# Patient Record
Sex: Female | Born: 1960
Health system: Southern US, Community
[De-identification: ages and names within clinical notes are randomized; demographics above are authoritative.]

## PROBLEM LIST (undated history)

## (undated) DIAGNOSIS — Z720 Tobacco use: Secondary | ICD-10-CM

## (undated) DIAGNOSIS — J449 Chronic obstructive pulmonary disease, unspecified: Secondary | ICD-10-CM

## (undated) DIAGNOSIS — M549 Dorsalgia, unspecified: Secondary | ICD-10-CM

## (undated) DIAGNOSIS — E785 Hyperlipidemia, unspecified: Secondary | ICD-10-CM

## (undated) DIAGNOSIS — K219 Gastro-esophageal reflux disease without esophagitis: Secondary | ICD-10-CM

## (undated) DIAGNOSIS — G8929 Other chronic pain: Secondary | ICD-10-CM

## (undated) DIAGNOSIS — K859 Acute pancreatitis without necrosis or infection, unspecified: Secondary | ICD-10-CM

## (undated) DIAGNOSIS — M797 Fibromyalgia: Secondary | ICD-10-CM

## (undated) DIAGNOSIS — Z8719 Personal history of other diseases of the digestive system: Secondary | ICD-10-CM

## (undated) DIAGNOSIS — G5600 Carpal tunnel syndrome, unspecified upper limb: Secondary | ICD-10-CM

## (undated) DIAGNOSIS — M542 Cervicalgia: Secondary | ICD-10-CM

## (undated) DIAGNOSIS — I471 Supraventricular tachycardia, unspecified: Secondary | ICD-10-CM

## (undated) DIAGNOSIS — R0602 Shortness of breath: Secondary | ICD-10-CM

## (undated) DIAGNOSIS — G5620 Lesion of ulnar nerve, unspecified upper limb: Secondary | ICD-10-CM

## (undated) DIAGNOSIS — D649 Anemia, unspecified: Secondary | ICD-10-CM

## (undated) DIAGNOSIS — G629 Polyneuropathy, unspecified: Secondary | ICD-10-CM

## (undated) DIAGNOSIS — Z8709 Personal history of other diseases of the respiratory system: Secondary | ICD-10-CM

## (undated) DIAGNOSIS — IMO0002 Reserved for concepts with insufficient information to code with codable children: Secondary | ICD-10-CM

## (undated) HISTORY — DX: Reserved for concepts with insufficient information to code with codable children: IMO0002

## (undated) HISTORY — DX: Fibromyalgia: M79.7

## (undated) HISTORY — DX: Hyperlipidemia, unspecified: E78.5

## (undated) HISTORY — DX: Gastro-esophageal reflux disease without esophagitis: K21.9

## (undated) HISTORY — DX: Supraventricular tachycardia, unspecified: I47.10

## (undated) HISTORY — PX: SPINAL FIXATION SURGERY W/ IMPLANT: SHX785

## (undated) HISTORY — DX: Supraventricular tachycardia: I47.1

## (undated) HISTORY — PX: BACK SURGERY: SHX140

## (undated) HISTORY — DX: Lesion of ulnar nerve, unspecified upper limb: G56.20

## (undated) HISTORY — DX: Carpal tunnel syndrome, unspecified upper limb: G56.00

## (undated) HISTORY — DX: Tobacco use: Z72.0

---

## 1993-06-07 HISTORY — PX: TUBAL LIGATION: SHX77

## 1999-07-09 ENCOUNTER — Ambulatory Visit: Admission: RE | Admit: 1999-07-09 | Discharge: 1999-07-09 | Payer: Self-pay | Admitting: Pulmonary Disease

## 1999-10-19 ENCOUNTER — Ambulatory Visit (HOSPITAL_COMMUNITY): Admission: RE | Admit: 1999-10-19 | Discharge: 1999-10-19 | Payer: Self-pay | Admitting: Gastroenterology

## 1999-10-19 ENCOUNTER — Encounter: Payer: Self-pay | Admitting: Gastroenterology

## 2000-04-19 ENCOUNTER — Encounter: Payer: Self-pay | Admitting: Gastroenterology

## 2000-04-19 ENCOUNTER — Ambulatory Visit (HOSPITAL_COMMUNITY): Admission: RE | Admit: 2000-04-19 | Discharge: 2000-04-19 | Payer: Self-pay | Admitting: Gastroenterology

## 2000-06-27 ENCOUNTER — Ambulatory Visit (HOSPITAL_COMMUNITY): Admission: RE | Admit: 2000-06-27 | Discharge: 2000-06-27 | Payer: Self-pay | Admitting: Pulmonary Disease

## 2000-06-27 ENCOUNTER — Encounter: Payer: Self-pay | Admitting: Pulmonary Disease

## 2000-07-01 ENCOUNTER — Ambulatory Visit (HOSPITAL_COMMUNITY): Admission: RE | Admit: 2000-07-01 | Discharge: 2000-07-01 | Payer: Self-pay | Admitting: Pulmonary Disease

## 2000-07-01 ENCOUNTER — Encounter: Payer: Self-pay | Admitting: Pulmonary Disease

## 2000-07-05 ENCOUNTER — Ambulatory Visit: Admission: RE | Admit: 2000-07-05 | Discharge: 2000-07-05 | Payer: Self-pay | Admitting: Pulmonary Disease

## 2000-11-08 ENCOUNTER — Encounter: Payer: Self-pay | Admitting: Orthopedic Surgery

## 2000-11-08 ENCOUNTER — Ambulatory Visit (HOSPITAL_COMMUNITY): Admission: RE | Admit: 2000-11-08 | Discharge: 2000-11-08 | Payer: Self-pay | Admitting: Orthopedic Surgery

## 2000-11-25 ENCOUNTER — Ambulatory Visit (HOSPITAL_COMMUNITY): Admission: RE | Admit: 2000-11-25 | Discharge: 2000-11-25 | Payer: Self-pay | Admitting: Orthopedic Surgery

## 2000-11-25 ENCOUNTER — Encounter: Payer: Self-pay | Admitting: Orthopedic Surgery

## 2000-11-27 ENCOUNTER — Ambulatory Visit (HOSPITAL_COMMUNITY): Admission: RE | Admit: 2000-11-27 | Discharge: 2000-11-27 | Payer: Self-pay | Admitting: Orthopedic Surgery

## 2000-11-27 ENCOUNTER — Encounter: Payer: Self-pay | Admitting: Orthopedic Surgery

## 2000-12-06 ENCOUNTER — Encounter (HOSPITAL_COMMUNITY): Admission: RE | Admit: 2000-12-06 | Discharge: 2001-01-04 | Payer: Self-pay | Admitting: Orthopedic Surgery

## 2001-01-04 ENCOUNTER — Encounter: Admission: RE | Admit: 2001-01-04 | Discharge: 2001-01-04 | Payer: Self-pay | Admitting: Hematology & Oncology

## 2001-03-13 ENCOUNTER — Encounter: Payer: Self-pay | Admitting: Urology

## 2001-03-13 ENCOUNTER — Ambulatory Visit (HOSPITAL_COMMUNITY): Admission: RE | Admit: 2001-03-13 | Discharge: 2001-03-13 | Payer: Self-pay | Admitting: Urology

## 2001-04-25 ENCOUNTER — Encounter: Admission: RE | Admit: 2001-04-25 | Discharge: 2001-04-25 | Payer: Self-pay | Admitting: Neurosurgery

## 2001-05-18 ENCOUNTER — Inpatient Hospital Stay (HOSPITAL_COMMUNITY): Admission: RE | Admit: 2001-05-18 | Discharge: 2001-05-22 | Payer: Self-pay | Admitting: Neurosurgery

## 2001-09-14 ENCOUNTER — Encounter: Admission: RE | Admit: 2001-09-14 | Discharge: 2001-10-06 | Payer: Self-pay | Admitting: Neurosurgery

## 2002-06-13 ENCOUNTER — Ambulatory Visit (HOSPITAL_COMMUNITY): Admission: RE | Admit: 2002-06-13 | Discharge: 2002-06-13 | Payer: Self-pay | Admitting: Urology

## 2002-06-13 ENCOUNTER — Encounter: Payer: Self-pay | Admitting: Urology

## 2002-06-28 ENCOUNTER — Ambulatory Visit (HOSPITAL_COMMUNITY): Admission: RE | Admit: 2002-06-28 | Discharge: 2002-06-28 | Payer: Self-pay | Admitting: Neurosurgery

## 2003-09-30 ENCOUNTER — Ambulatory Visit (HOSPITAL_COMMUNITY): Admission: RE | Admit: 2003-09-30 | Discharge: 2003-09-30 | Payer: Self-pay | Admitting: Internal Medicine

## 2003-10-03 ENCOUNTER — Ambulatory Visit (HOSPITAL_COMMUNITY): Admission: RE | Admit: 2003-10-03 | Discharge: 2003-10-03 | Payer: Self-pay | Admitting: Internal Medicine

## 2003-11-13 ENCOUNTER — Ambulatory Visit (HOSPITAL_COMMUNITY): Admission: RE | Admit: 2003-11-13 | Discharge: 2003-11-13 | Payer: Self-pay | Admitting: Neurosurgery

## 2003-11-24 ENCOUNTER — Emergency Department (HOSPITAL_COMMUNITY): Admission: EM | Admit: 2003-11-24 | Discharge: 2003-11-24 | Payer: Self-pay | Admitting: Emergency Medicine

## 2004-01-01 ENCOUNTER — Ambulatory Visit (HOSPITAL_COMMUNITY): Admission: RE | Admit: 2004-01-01 | Discharge: 2004-01-01 | Payer: Self-pay | Admitting: Internal Medicine

## 2004-01-07 ENCOUNTER — Ambulatory Visit (HOSPITAL_COMMUNITY): Admission: RE | Admit: 2004-01-07 | Discharge: 2004-01-07 | Payer: Self-pay | Admitting: Internal Medicine

## 2004-01-15 ENCOUNTER — Ambulatory Visit (HOSPITAL_COMMUNITY): Admission: RE | Admit: 2004-01-15 | Discharge: 2004-01-15 | Payer: Self-pay | Admitting: Internal Medicine

## 2004-06-29 ENCOUNTER — Ambulatory Visit (HOSPITAL_COMMUNITY): Admission: RE | Admit: 2004-06-29 | Discharge: 2004-06-29 | Payer: Self-pay | Admitting: Family Medicine

## 2004-07-23 ENCOUNTER — Emergency Department (HOSPITAL_COMMUNITY): Admission: EM | Admit: 2004-07-23 | Discharge: 2004-07-23 | Payer: Self-pay | Admitting: Emergency Medicine

## 2004-09-04 ENCOUNTER — Ambulatory Visit (HOSPITAL_COMMUNITY): Admission: RE | Admit: 2004-09-04 | Discharge: 2004-09-04 | Payer: Self-pay | Admitting: *Deleted

## 2004-10-30 ENCOUNTER — Observation Stay (HOSPITAL_COMMUNITY): Admission: EM | Admit: 2004-10-30 | Discharge: 2004-10-30 | Payer: Self-pay | Admitting: Emergency Medicine

## 2005-03-14 ENCOUNTER — Emergency Department (HOSPITAL_COMMUNITY): Admission: EM | Admit: 2005-03-14 | Discharge: 2005-03-14 | Payer: Self-pay | Admitting: Emergency Medicine

## 2007-06-08 HISTORY — PX: CARPAL TUNNEL RELEASE: SHX101

## 2007-06-08 HISTORY — PX: GANGLION CYST EXCISION: SHX1691

## 2007-12-26 ENCOUNTER — Emergency Department (HOSPITAL_COMMUNITY): Admission: EM | Admit: 2007-12-26 | Discharge: 2007-12-26 | Payer: Self-pay | Admitting: Emergency Medicine

## 2008-02-05 ENCOUNTER — Ambulatory Visit: Payer: Self-pay | Admitting: Orthopedic Surgery

## 2008-02-05 DIAGNOSIS — G5600 Carpal tunnel syndrome, unspecified upper limb: Secondary | ICD-10-CM | POA: Insufficient documentation

## 2008-02-09 ENCOUNTER — Emergency Department (HOSPITAL_COMMUNITY): Admission: EM | Admit: 2008-02-09 | Discharge: 2008-02-09 | Payer: Self-pay | Admitting: Emergency Medicine

## 2008-02-20 ENCOUNTER — Telehealth: Payer: Self-pay | Admitting: Orthopedic Surgery

## 2008-02-28 ENCOUNTER — Encounter: Payer: Self-pay | Admitting: Orthopedic Surgery

## 2008-03-05 ENCOUNTER — Encounter: Payer: Self-pay | Admitting: Orthopedic Surgery

## 2008-03-13 ENCOUNTER — Ambulatory Visit: Payer: Self-pay | Admitting: Orthopedic Surgery

## 2008-03-13 DIAGNOSIS — G5620 Lesion of ulnar nerve, unspecified upper limb: Secondary | ICD-10-CM

## 2008-03-22 ENCOUNTER — Telehealth: Payer: Self-pay | Admitting: Orthopedic Surgery

## 2008-05-06 ENCOUNTER — Encounter: Payer: Self-pay | Admitting: Orthopedic Surgery

## 2008-07-26 ENCOUNTER — Encounter: Payer: Self-pay | Admitting: Orthopedic Surgery

## 2008-08-15 ENCOUNTER — Encounter: Payer: Self-pay | Admitting: *Deleted

## 2008-08-15 ENCOUNTER — Ambulatory Visit (HOSPITAL_COMMUNITY): Admission: RE | Admit: 2008-08-15 | Discharge: 2008-08-15 | Payer: Self-pay | Admitting: Family Medicine

## 2008-08-15 LAB — CONVERTED CEMR LAB
Albumin: 4.6 g/dL
Alkaline Phosphatase: 105 units/L
CO2: 23 meq/L
Chloride: 102 meq/L
Eosinophils Absolute: 0.2 10*3/uL
Eosinophils Relative: 2 %
Hemoglobin: 14.6 g/dL
LDL Cholesterol: 170 mg/dL
Lymphs Abs: 2.2 10*3/uL
Monocytes Relative: 5 %
RBC: 4.68 M/uL
Sodium: 140 meq/L
Total Protein: 7.2 g/dL

## 2008-08-30 ENCOUNTER — Encounter: Payer: Self-pay | Admitting: Orthopedic Surgery

## 2008-11-13 ENCOUNTER — Encounter (INDEPENDENT_AMBULATORY_CARE_PROVIDER_SITE_OTHER): Payer: Self-pay | Admitting: *Deleted

## 2008-12-13 ENCOUNTER — Encounter: Payer: Self-pay | Admitting: Orthopedic Surgery

## 2009-02-03 DIAGNOSIS — R110 Nausea: Secondary | ICD-10-CM

## 2009-02-03 DIAGNOSIS — R1013 Epigastric pain: Secondary | ICD-10-CM

## 2009-02-03 DIAGNOSIS — R109 Unspecified abdominal pain: Secondary | ICD-10-CM | POA: Insufficient documentation

## 2009-02-04 ENCOUNTER — Encounter (INDEPENDENT_AMBULATORY_CARE_PROVIDER_SITE_OTHER): Payer: Self-pay | Admitting: *Deleted

## 2009-06-19 ENCOUNTER — Emergency Department (HOSPITAL_COMMUNITY): Admission: EM | Admit: 2009-06-19 | Discharge: 2009-06-19 | Payer: Self-pay | Admitting: Emergency Medicine

## 2009-07-03 ENCOUNTER — Emergency Department (HOSPITAL_COMMUNITY): Admission: EM | Admit: 2009-07-03 | Discharge: 2009-07-03 | Payer: Self-pay | Admitting: Internal Medicine

## 2009-07-06 ENCOUNTER — Emergency Department (HOSPITAL_COMMUNITY): Admission: EM | Admit: 2009-07-06 | Discharge: 2009-07-06 | Payer: Self-pay | Admitting: Emergency Medicine

## 2009-08-11 ENCOUNTER — Emergency Department (HOSPITAL_COMMUNITY): Admission: EM | Admit: 2009-08-11 | Discharge: 2009-08-11 | Payer: Self-pay | Admitting: Emergency Medicine

## 2009-09-01 ENCOUNTER — Emergency Department (HOSPITAL_COMMUNITY): Admission: EM | Admit: 2009-09-01 | Discharge: 2009-09-01 | Payer: Self-pay | Admitting: Emergency Medicine

## 2009-10-13 ENCOUNTER — Encounter (INDEPENDENT_AMBULATORY_CARE_PROVIDER_SITE_OTHER): Payer: Self-pay | Admitting: *Deleted

## 2010-04-07 ENCOUNTER — Encounter: Payer: Self-pay | Admitting: Orthopedic Surgery

## 2010-04-22 ENCOUNTER — Encounter: Admission: RE | Admit: 2010-04-22 | Discharge: 2010-04-22 | Payer: Self-pay | Admitting: Neurosurgery

## 2010-05-10 ENCOUNTER — Emergency Department (HOSPITAL_COMMUNITY)
Admission: EM | Admit: 2010-05-10 | Discharge: 2010-05-10 | Payer: Self-pay | Source: Home / Self Care | Admitting: Emergency Medicine

## 2010-05-10 ENCOUNTER — Encounter: Payer: Self-pay | Admitting: Cardiology

## 2010-05-22 ENCOUNTER — Ambulatory Visit: Payer: Self-pay | Admitting: Cardiology

## 2010-05-27 ENCOUNTER — Encounter: Payer: Self-pay | Admitting: Orthopedic Surgery

## 2010-06-05 ENCOUNTER — Encounter: Payer: Self-pay | Admitting: Cardiology

## 2010-06-12 ENCOUNTER — Encounter
Admission: RE | Admit: 2010-06-12 | Discharge: 2010-06-12 | Payer: Self-pay | Source: Home / Self Care | Attending: Neurosurgery | Admitting: Neurosurgery

## 2010-06-28 ENCOUNTER — Encounter: Payer: Self-pay | Admitting: Internal Medicine

## 2010-07-07 NOTE — Consult Note (Signed)
Summary: [facility] consult Dr Nelida Meuse consult Dr Lovell Sheehan   Imported By: Cammie Sickle 04/07/2010 18:31:58  _____________________________________________________________________  External Attachment:    Type:   Image     Comment:   External Document

## 2010-07-07 NOTE — Letter (Signed)
Summary: *Orthopedic No Show Letter  [facility]  [address]  [place], [place] [address]   Phone: [phone]  Fax: [phone]     10/13/2009    Susan Potts [address] [place]  [address]    Dear Ms. Delford Field,   Our records indicate that you missed your scheduled appointment with Dr. Beaulah Corin on 10/08/2009.  Please contact this office to reschedule your appointment as soon as possible.  It is important that you keep your scheduled appointments with your physician, so we can provide you the best care possible.        Sincerely,    Dr. Terrance Mass, MD [facility] Phone [phone]

## 2010-07-08 HISTORY — PX: LUMBAR DISC SURGERY: SHX700

## 2010-07-09 NOTE — Consult Note (Signed)
Summary: Consultation Report from Dr. Tressie Stalker  Consultation Report from Dr. Tressie Stalker   Imported By: Jacklynn Ganong 06/02/2010 16:44:07  _____________________________________________________________________  External Attachment:    Type:   Image     Comment:   External Document

## 2010-07-09 NOTE — Miscellaneous (Signed)
Summary: ROV post-hosp; pt. left prior to eval. by MD   Visit Type:  Initial Consult   History of Present Illness: pt was not seen this day, she left for a prior appt., she cancelled her appt that she rescheduled for the following day  Current Medications (verified): 1)  Promethazine Hcl 25 Mg Tabs (Promethazine Hcl) .... Taqke As Needed 2)  Norco 10-325 Mg Tabs (Hydrocodone-Acetaminophen) .... Take As Needed 3)  Flexeril 10 Mg Tabs (Cyclobenzaprine Hcl) .... Take As Needed 4)  Atenolol 25 Mg Tabs (Atenolol) .... Take 1 Tab Daily  Allergies (verified): 1)  ! Codeine 2)  ! Sulfa  Vital Signs:  Patient profile:   50 year old female Weight:      176 pounds O2 Sat:      97 % on Room air Pulse rate:   85 / minute BP sitting:   100 / 71  (right arm)  Vitals Entered By: Teressa Lower RN (June 05, 2010 1:37 PM)  O2 Flow:  Room air

## 2010-07-14 ENCOUNTER — Encounter: Payer: Self-pay | Admitting: Orthopedic Surgery

## 2010-07-28 ENCOUNTER — Encounter: Payer: Self-pay | Admitting: Cardiology

## 2010-08-04 ENCOUNTER — Other Ambulatory Visit (HOSPITAL_COMMUNITY): Payer: Medicare HMO

## 2010-08-04 NOTE — Letter (Signed)
Summary: [facility] office notes Dr Nelida Meuse office notes Dr Lovell Sheehan   Imported By: Cammie Sickle 07/31/2010 13:57:57  _____________________________________________________________________  External Attachment:    Type:   Image     Comment:   External Document

## 2010-08-05 ENCOUNTER — Inpatient Hospital Stay (HOSPITAL_COMMUNITY)
Admission: RE | Admit: 2010-08-05 | Discharge: 2010-08-06 | DRG: 491 | Disposition: A | Discharge status: Home / Self Care | Payer: Medicare HMO | Source: Ambulatory Visit | Attending: Neurosurgery | Admitting: Neurosurgery

## 2010-08-05 ENCOUNTER — Inpatient Hospital Stay (HOSPITAL_COMMUNITY): Payer: Medicare HMO

## 2010-08-05 DIAGNOSIS — G4733 Obstructive sleep apnea (adult) (pediatric): Secondary | ICD-10-CM | POA: Diagnosis present

## 2010-08-05 DIAGNOSIS — M5126 Other intervertebral disc displacement, lumbar region: Principal | ICD-10-CM | POA: Diagnosis present

## 2010-08-05 DIAGNOSIS — J4489 Other specified chronic obstructive pulmonary disease: Secondary | ICD-10-CM | POA: Diagnosis present

## 2010-08-05 DIAGNOSIS — J449 Chronic obstructive pulmonary disease, unspecified: Secondary | ICD-10-CM | POA: Diagnosis present

## 2010-08-05 DIAGNOSIS — K219 Gastro-esophageal reflux disease without esophagitis: Secondary | ICD-10-CM | POA: Diagnosis present

## 2010-08-05 DIAGNOSIS — F17200 Nicotine dependence, unspecified, uncomplicated: Secondary | ICD-10-CM | POA: Diagnosis present

## 2010-08-05 DIAGNOSIS — M4800 Spinal stenosis, site unspecified: Secondary | ICD-10-CM | POA: Diagnosis present

## 2010-08-05 LAB — CBC
HCT: 40.3 % (ref 36.0–46.0)
Hemoglobin: 13.7 g/dL (ref 12.0–15.0)
MCH: 32 pg (ref 26.0–34.0)
MCHC: 34 g/dL (ref 30.0–36.0)
RDW: 13.4 % (ref 11.5–15.5)

## 2010-08-05 LAB — BASIC METABOLIC PANEL
BUN: 12 mg/dL (ref 6–23)
CO2: 29 mEq/L (ref 19–32)
Calcium: 9.3 mg/dL (ref 8.4–10.5)
Creatinine, Ser: 0.6 mg/dL (ref 0.4–1.2)
GFR calc non Af Amer: >60 mL/min (ref >60)
Glucose, Bld: 99 mg/dL (ref 70–99)
Sodium: 141 mEq/L (ref 135–145)

## 2010-08-14 NOTE — Op Note (Signed)
Susan Potts, MCCUISTION              ACCOUNT NO.:  [id number]  MEDICAL RECORD NO.:  [id number]           PATIENT TYPE:  I  LOCATION:  3536                         FACILITY:  [facility]  PHYSICIAN:  Cristi Loron, M.D.DATE OF BIRTH:  [birth year]  DATE OF PROCEDURE:  08/05/2010 DATE OF DISCHARGE:                              OPERATIVE REPORT   BRIEF HISTORY:  The patient is a 50 year old white female who has suffered from back and left leg pain consistent with a left L3 radiculopathy.  She has failed medical management and was worked up with a lumbar MRI, which demonstrated the patient a far lateral ruptured disk at L3-4 on the left.  I discussed the various treatment options with the patient including surgery.  She has weighed the risks, benefits and alternatives of surgery and decided to proceed with the left L3-4 far lateral diskectomy.  PREOPERATIVE DIAGNOSES: 1. Left L3-4 far lateral herniated nucleus pulposus. 2. Lumbar radiculopathy. 3. Lumbago. 4. Spinal stenosis.  POSTOPERATIVE DIAGNOSES: 1. Left L3-4 far lateral herniated nucleus pulposus. 2. Lumbar radiculopathy. 3. Lumbago. 4. Spinal stenosis.  PROCEDURE:  Left L3-4 far lateral diskectomy using microdissection.  SURGEON:  Cristi Loron, MD  ASSISTANT:  Clydene Fake, MD  ANESTHESIA:  General endotracheal.  ESTIMATED BLOOD LOSS:  25 mL.  SPECIMENS:  None.  DRAINS:  None.  COMPLICATIONS:  None.  DESCRIPTION OF PROCEDURE:  The patient was brought to the operating room by the Anesthesia Team.  General endotracheal anesthesia was induced. The patient was turned to the prone position on the Wilson frame.  The lumbosacral region was then prepared with Betadine scrub and Betadine solution.  Sterile drapes were applied.  I then injected the area to be incised with Marcaine with epinephrine solution.  A scalpel to make a linear midline incision at the L3-4 interspace.  I used electrocautery to perform a  left-sided subperiosteal dissection exposing the spinous process and lamina of L3 and L4.  We obtained intraoperative radiograph and then inserted the [facility] retractor for exposure.  We then brought the operative microscope into the field and under its magnification and illumination, we completed the microdissection/decompression.  I used a speed drill to drill the lateral aspect of the left L3 pars.  I then used microdissection to dissect through the interspinous ligament.  I removed this with Kerrison punch.  This exposed the exiting L3 nerve root.  I used microdissection to free up the nerve root and then just caudal to the nerve root was a free fragment disk herniation.  I removed it with multiple fragments using the pituitary forceps.  I then inspected the intervertebral disk L3-4.  There was no large holes in the annulus, so I therefore did not enter into the intervertebral disk space.  I palpated along the exit route of the L3 nerve root and noted it was well decompressed from the intraspinal portion all the way into soft tissues.  We then obtained hemostasis using bipolar cautery.  I irrigated the wound out with bacitracin solution.  We then removed the retractor and then reapproximated the patient's thoracolumbar fascia with interrupted #1 Vicryl suture,  the subcutaneous tissue with interrupted 2-0 Vicryl suture, and the skin with Steri-Strips and Benzoin.  The wound was then coated with bacitracin ointment and sterile dressing was applied.  The drapes were removed.  The patient was subsequently returned to the supine position where she was extubated by the Anesthesia Team and transported to Post Anesthesia Care Unit in stable condition.  All sponge, instrument and needle counts were correct at the end of this case.     Cristi Loron, M.D.     JDJ/MEDQ  D:  08/05/2010  T:  08/06/2010  Job:  [id number]  Electronically Signed by Tressie Stalker M.D. on 08/13/2010  02:37:56 PM

## 2010-08-17 ENCOUNTER — Encounter: Payer: Self-pay | Admitting: *Deleted

## 2010-08-17 LAB — POCT CARDIAC MARKERS
CKMB, poc: <1 ng/mL — ABNORMAL LOW (ref 1.0–8.0)
Myoglobin, poc: 26.2 ng/mL (ref 12–200)

## 2010-08-17 LAB — DIFFERENTIAL
Eosinophils Absolute: 0.1 10*3/uL (ref 0.0–0.7)
Lymphocytes Relative: 27 % (ref 12–46)
Lymphs Abs: 2.4 10*3/uL (ref 0.7–4.0)
Neutro Abs: 6 10*3/uL (ref 1.7–7.7)
Neutrophils Relative %: 66 % (ref 43–77)

## 2010-08-17 LAB — CBC
MCH: 32.6 pg (ref 26.0–34.0)
Platelets: 167 10*3/uL (ref 150–400)
RBC: 4.66 MIL/uL (ref 3.87–5.11)
WBC: 9.1 10*3/uL (ref 4.0–10.5)

## 2010-08-17 LAB — BASIC METABOLIC PANEL
Calcium: 8.9 mg/dL (ref 8.4–10.5)
Creatinine, Ser: 0.7 mg/dL (ref 0.4–1.2)
GFR calc Af Amer: >60 mL/min (ref >60)
GFR calc non Af Amer: >60 mL/min (ref >60)
Sodium: 138 mEq/L (ref 135–145)

## 2010-08-18 ENCOUNTER — Encounter (INDEPENDENT_AMBULATORY_CARE_PROVIDER_SITE_OTHER): Payer: Medicare HMO | Admitting: Cardiology

## 2010-08-18 ENCOUNTER — Encounter: Payer: Self-pay | Admitting: Cardiology

## 2010-08-18 DIAGNOSIS — J449 Chronic obstructive pulmonary disease, unspecified: Secondary | ICD-10-CM

## 2010-08-18 DIAGNOSIS — I471 Supraventricular tachycardia: Secondary | ICD-10-CM

## 2010-08-18 DIAGNOSIS — R079 Chest pain, unspecified: Secondary | ICD-10-CM

## 2010-08-18 DIAGNOSIS — F17200 Nicotine dependence, unspecified, uncomplicated: Secondary | ICD-10-CM

## 2010-08-18 DIAGNOSIS — E7800 Pure hypercholesterolemia, unspecified: Secondary | ICD-10-CM

## 2010-08-19 ENCOUNTER — Encounter: Payer: Self-pay | Admitting: Adult Health

## 2010-08-23 LAB — DIFFERENTIAL
Basophils Absolute: 0 10*3/uL (ref 0.0–0.1)
Basophils Relative: 1 % (ref 0–1)
Neutro Abs: 4 10*3/uL (ref 1.7–7.7)
Neutrophils Relative %: 57 % (ref 43–77)

## 2010-08-23 LAB — BASIC METABOLIC PANEL
CO2: 28 mEq/L (ref 19–32)
Calcium: 9.4 mg/dL (ref 8.4–10.5)
Creatinine, Ser: 0.57 mg/dL (ref 0.4–1.2)
GFR calc Af Amer: >60 mL/min (ref >60)
Glucose, Bld: 111 mg/dL — ABNORMAL HIGH (ref 70–99)

## 2010-08-23 LAB — CBC
MCHC: 34.7 g/dL (ref 30.0–36.0)
RBC: 4.51 MIL/uL (ref 3.87–5.11)
RDW: 12.9 % (ref 11.5–15.5)

## 2010-08-25 NOTE — Miscellaneous (Signed)
Summary: CBCD,CMP,LIPIDS,TSH,08/15/2008  Clinical Lists Changes  Observations: Added new observation of CALCIUM: 9.7 mg/dL (54/02/8118 14:78) Added new observation of ALBUMIN: 4.6 g/dL (29/56/2130 86:57) Added new observation of PROTEIN, TOT: 7.2 g/dL (84/69/6295 28:41) Added new observation of SGPT (ALT): 11 units/L (08/15/2008 10:39) Added new observation of SGOT (AST): 13 units/L (08/15/2008 10:39) Added new observation of ALK PHOS: 105 units/L (08/15/2008 10:39) Added new observation of CREATININE: 0.64 mg/dL (32/44/0102 72:53) Added new observation of BUN: 13 mg/dL (66/44/0347 42:59) Added new observation of BG RANDOM: 100 mg/dL (56/38/7564 33:29) Added new observation of CO2 PLSM/SER: 23 meq/L (08/15/2008 10:39) Added new observation of CL SERUM: 102 meq/L (08/15/2008 10:39) Added new observation of K SERUM: 4.5 meq/L (08/15/2008 10:39) Added new observation of NA: 140 meq/L (08/15/2008 10:39) Added new observation of LDL: 170 mg/dL (51/88/4166 06:30) Added new observation of HDL: 51 mg/dL (16/06/930 35:57) Added new observation of TRIGLYC TOT: 293 mg/dL (32/20/2542 70:62) Added new observation of CHOLESTEROL: 280 mg/dL (37/62/8315 17:61) Added new observation of TSH: 1.680 microintl units/mL (08/15/2008 10:39) Added new observation of ABSOLUTE BAS: 0.0 K/uL (08/15/2008 10:39) Added new observation of BASOPHIL %: 0 % (08/15/2008 10:39) Added new observation of EOS ABSLT: 0.2 K/uL (08/15/2008 10:39) Added new observation of % EOS AUTO: 2 % (08/15/2008 10:39) Added new observation of ABSOLUTE MON: 0.5 K/uL (08/15/2008 10:39) Added new observation of MONOCYTE %: 5 % (08/15/2008 10:39) Added new observation of ABS LYMPHOCY: 2.2 K/uL (08/15/2008 10:39) Added new observation of LYMPHS %: 26 % (08/15/2008 10:39) Added new observation of PLATELETK/UL: 214 K/uL (08/15/2008 10:39) Added new observation of RDW: 13.5 % (08/15/2008 10:39) Added new observation of MCHC RBC: 32.9 g/dL  (60/73/7106 26:94) Added new observation of MCV: 94.9 fL (08/15/2008 10:39) Added new observation of HCT: 44.4 % (08/15/2008 10:39) Added new observation of HGB: 14.6 g/dL (85/46/2703 50:09) Added new observation of RBC M/UL: 4.68 M/uL (08/15/2008 10:39) Added new observation of WBC COUNT: 8.5 10*3/microliter (08/15/2008 10:39)

## 2010-08-25 NOTE — Assessment & Plan Note (Signed)
Summary: NPH...SVT...SEEN IN ER [place] PER [facility] TO [place] OFFI...   Visit Type:  Follow-up Primary Provider:  Dr.Mcphal [facility]   History of Present Illness: Susan Potts is a 46 CF we are seeing at that request of Dr. Tressa Busman of [facility] for evaluation of SVT.  Susan Potts has been seen in [facility] ER in December of 2011 after chasing her dog and having a rapid HR that would not subside. EMS was called and (per pt) she was having SVT.  Review of ER records from that time recorded that SVT that was resolved by adenosine.  She was restarted on atenolol which she had stopped taking.  She has had a left L3 far lateral diskectomy on Aug 05, 2010 and therefore was delayed in seeking cardiac evaluation.  She has been followed by [facility] in the past which included a cardiac catherization completed 10/2004 in the setting of chest pain.  This revealed normal coronary arteries, normal LV fx, and normal renal arteries.  She was also seen by a physician in [facility] and had a cardiolite stress test which caused her to have rapid HR-SVT per records.  She has been feeling generally lousey since surgery. She continues chronic pain, heartburn, myalgia symptoms, and chronic DOE.  She denies recurrance of SVT since restarting the atenolol.  Preventive Screening-Counseling & Management  Alcohol-Tobacco     Alcohol drinks/day: 0     Smoking Status: current     Smoking Cessation Counseling: yes     Smoke Cessation Stage: precontemplative     Packs/Day: 1.0     Year Started: 1975     Pack years: 40  Current Medications (verified): 1)  Promethazine Hcl 25 Mg Tabs (Promethazine Hcl) .... Taqke As Needed 2)  Flexeril 10 Mg Tabs (Cyclobenzaprine Hcl) .... Take As Needed 3)  Percocet 10-325 Mg Tabs (Oxycodone-Acetaminophen) .... As Needed 4)  Xanax 0.25 Mg Tabs (Alprazolam) .... Take Three Times A Day 5)  Diazepam 5 Mg/ml Soln (Diazepam) .... As Needed 6)  Promethazine Vc/codeine 6.25-5-10  Mg/50ml Syrp (Phenyleph-Promethazine-Cod) .... As Needed 7)  Cymbalta 60 Mg Cpep (Duloxetine Hcl) .... Take 1 Tab Daily 8)  Diltiazem Hcl Er Beads 120 Mg Xr24h-Cap (Diltiazem Hcl Er Beads) .... Take One Capsule By Mouth Daily 9)  Protonix 40 Mg Tbec (Pantoprazole Sodium) .... Take 1 Tablet By Mouth Once A Day  Allergies (verified): 1)  ! Codeine 2)  ! Sulfa  Comments:  Nurse/Medical Assistant: patient brought meds she uses Administrator, sports   Past History:  Past Medical History: CTS Acid reflux fibromyalgia DDD Rod at L5 COPD Cardiac catherizaton 2006-[facility] normal coronaries and LV fx.  Social History: unemployed Disabled  Divorced  Tobacco Use - Yes.  Alcohol Use - no Former ETOH abuse. Alcohol drinks/day:  0 Packs/Day:  1.0 Pack years:  40  Review of Systems       Myalgias and rapid heart rate.  All other systems have been reviewed and are negative unless stated above.   Vital Signs:  Patient profile:   50 year old female Height:      67 inches Weight:      190 pounds BMI:     29.87 Pulse rate:   77 / minute BP sitting:   116 / 73  (left arm)  Vitals Entered By: Dreama Saa, CNA (August 18, 2010 3:00 PM)  Physical Exam  General:  normal appearance.   Head:  normocephalic and atraumatic Eyes:  PERRLA/EOM intact;  conjunctiva and lids normal. Ears:  TM's intact and clear with normal canals and hearing Nose:  no deformity, discharge, inflammation, or lesions Mouth:  Teeth, gums and palate normal. Oral mucosa normal. Neck:  Neck supple, no JVD. No masses, thyromegaly or abnormal cervical nodes. Lungs:  Clear bilaterally to auscultation and percussion. Heart:  Non-displaced PMI, chest non-tender; regular rate and rhythm, S1, S2 without murmurs, rubs or gallops. Carotid upstroke normal, no bruit. Normal abdominal aortic size, no bruits. Femorals normal pulses, no bruits. Pedals normal pulses. No edema, no varicosities. Abdomen:  Bowel sounds positive; abdomen  soft and non-tender without masses, organomegaly, or hernias noted. No hepatosplenomegaly. Msk:  decreased ROM.  Back stiffness with recent back surgery. Well healed scar in the lower lumbar area. Pulses:  pulses normal in all 4 extremities Extremities:  No clubbing or cyanosis. Neurologic:  Alert and oriented x 3. Psych:  depressed affect.     EKG  Procedure date:  08/18/2010  Findings:      Normal sinus rhythm with rate of: 78 bpm Non-specific ST-T wave changes noted.    Impression & Recommendations:  Problem # 1:  PSVT (ICD-427.0) We will request records from EMS to ascertain her HR and rhythm, along with records from [facility] and cardiologist in [facility].  it is possible that she has PSVT.  With history of COPD, will d/c atenolol.  The dose does not appear to be of significance to control SVT should she have it.  Will start diltiazem 120mg  daily instead.  We will check a TSH.  Review of cardiac catherization is reassuring concerning CAD. If records demonstrate recurrent SVT, will consider sending her for EP consultation.  The patient has been seen and examined by Dr. Dietrich Pates on this visit. The following medications were removed from the medication list:    Atenolol 25 Mg Tabs (Atenolol) .Marland Kitchen... Take 1 tab daily Her updated medication list for this problem includes:    Diltiazem Hcl Er Beads 120 Mg Xr24h-cap (Diltiazem hcl er beads) .Marland Kitchen... Take one capsule by mouth daily  Problem # 2:  Hx of EPIGASTRIC PAIN (ICD-789.06) Will start Protonix 40mg  daily to assist with her symptoms.  She may need to have GI evaluation if symptoms persist.  Problem # 3:  HYPERCHOLESTEROLEMIA (ICD-272.0) Most recent labs are from 08/15/2008. Will need to have these completed again if not done recently per records that we have requested.  Patient Instructions: 1)  Your physician recommends that you schedule a follow-up appointment in: 2 months 2)  Your physician has recommended you make the following  change in your medication: protonix 40mg  dialy, stop atenolol, begin diltiazem 120mg  daily Prescriptions: PROTONIX 40 MG TBEC (PANTOPRAZOLE SODIUM) Take 1 tablet by mouth once a day  #30 x 6   Entered by:   Teressa Lower RN   Authorized by:   Kathlen Brunswick, MD, [facility]   Signed by:   Teressa Lower RN on 08/18/2010   Method used:   Faxed to ...       Hospital doctor (retail)       [address]       [place], [place]  [address]       Ph: [phone] or [phone]       Fax: [phone]   RxID:   [id number] DILTIAZEM HCL ER BEADS 120 MG XR24H-CAP (DILTIAZEM HCL ER BEADS) Take one capsule by mouth daily  #30 x 6  Entered by:   Teressa Lower RN   Authorized by:   Kathlen Brunswick, MD, [facility]   Signed by:   Teressa Lower RN on 08/18/2010   Method used:   Faxed to ...       Hospital doctor (retail)       [address]       [place], [place]  [address]       Ph: [phone] or [phone]       Fax: [phone]   RxID:   [id number]

## 2010-08-26 ENCOUNTER — Encounter: Payer: Self-pay | Admitting: Cardiology

## 2010-08-30 LAB — DIFFERENTIAL
Eosinophils Absolute: 0 10*3/uL (ref 0.0–0.7)
Eosinophils Relative: 0 % (ref 0–5)
Lymphs Abs: 0.5 10*3/uL — ABNORMAL LOW (ref 0.7–4.0)
Monocytes Absolute: 0.2 10*3/uL (ref 0.1–1.0)
Monocytes Relative: 6 % (ref 3–12)

## 2010-08-30 LAB — COMPREHENSIVE METABOLIC PANEL
ALT: 13 U/L (ref 0–35)
AST: 16 U/L (ref 0–37)
Albumin: 3.8 g/dL (ref 3.5–5.2)
CO2: 24 mEq/L (ref 19–32)
Calcium: 8.6 mg/dL (ref 8.4–10.5)
GFR calc Af Amer: >60 mL/min (ref >60)
GFR calc non Af Amer: >60 mL/min (ref >60)
Sodium: 133 mEq/L — ABNORMAL LOW (ref 135–145)

## 2010-08-30 LAB — CBC
MCHC: 35.1 g/dL (ref 30.0–36.0)
Platelets: 112 10*3/uL — ABNORMAL LOW (ref 150–400)
RBC: 4.28 MIL/uL (ref 3.87–5.11)
WBC: 4.1 10*3/uL (ref 4.0–10.5)

## 2010-10-09 ENCOUNTER — Encounter: Payer: Self-pay | Admitting: Cardiology

## 2010-10-09 DIAGNOSIS — IMO0002 Reserved for concepts with insufficient information to code with codable children: Secondary | ICD-10-CM

## 2010-10-09 DIAGNOSIS — M797 Fibromyalgia: Secondary | ICD-10-CM

## 2010-10-15 ENCOUNTER — Encounter: Payer: Self-pay | Admitting: Cardiology

## 2010-10-16 ENCOUNTER — Encounter: Payer: Self-pay | Admitting: Cardiology

## 2010-10-16 DIAGNOSIS — M797 Fibromyalgia: Secondary | ICD-10-CM | POA: Insufficient documentation

## 2010-10-16 DIAGNOSIS — IMO0002 Reserved for concepts with insufficient information to code with codable children: Secondary | ICD-10-CM | POA: Insufficient documentation

## 2010-10-20 ENCOUNTER — Encounter: Payer: Self-pay | Admitting: Cardiology

## 2010-10-20 ENCOUNTER — Ambulatory Visit: Payer: Medicare HMO | Admitting: Cardiology

## 2010-10-20 DIAGNOSIS — E785 Hyperlipidemia, unspecified: Secondary | ICD-10-CM | POA: Insufficient documentation

## 2010-10-20 DIAGNOSIS — J449 Chronic obstructive pulmonary disease, unspecified: Secondary | ICD-10-CM | POA: Insufficient documentation

## 2010-10-20 DIAGNOSIS — K219 Gastro-esophageal reflux disease without esophagitis: Secondary | ICD-10-CM | POA: Insufficient documentation

## 2010-10-20 DIAGNOSIS — Z720 Tobacco use: Secondary | ICD-10-CM | POA: Insufficient documentation

## 2010-10-23 NOTE — Discharge Summary (Signed)
Susan Potts, Susan Potts              ACCOUNT NO.:  [id number]   MEDICAL RECORD NO.:  [id number]          PATIENT TYPE:  INP   LOCATION:  3731                         FACILITY:  [facility]   PHYSICIAN:  Dani Gobble, MD       DATE OF BIRTH:  Mar 30, 1961   DATE OF ADMISSION:  10/30/2004  DATE OF DISCHARGE:  10/30/2004                                 DISCHARGE SUMMARY   DISCHARGE DIAGNOSES:  1.  Chest discomfort, nitrate-responsive and with catheterization, normal      coronary arteries.  2.  Chronic obstructive pulmonary disease.  3.  Gastroesophageal reflux disease.  4.  Degenerative joint disease.  5.  Shortness of breath, dyspnea on exertion.   DISCHARGE CONDITION:  Improved.   PROCEDURES:  Combined left heart catheterization by Cristy Hilts. Jacinto Halim, MD, on Oct 30, 2004, with patent coronary arteries and ejection fraction of 65%.   DISCHARGE MEDICATIONS:  1.  Valium 5 mg three times a day.  2.  Prevacid 30 mg twice a day.  3.  Wellbutrin XL 150 mg daily.  4.  Metoclopramide 10 mg three times a day.  5.  Hydrocodone as before.  6.  Lasix and potassium as before.  7.  Flovent inhaler twice a day.  8.  Combivent inhaler every four hours.  9.  Vytorin 10/20 mg once a day.   DISCHARGE INSTRUCTIONS:  1.  Diet without restriction.  2.  Activity:  Increase activity slowly.  No lifting over five pounds for      three days, no driving for three days, no smoking.  3.  Wash catheterization site with soap and water.  Call for any bleeding,      swelling or drainage.  4.  Follow up with Dr. Domingo Sep in one to two weeks.  Call the office for an      appointment.  5.  No tub baths for one week.  Okay to shower.  No swimming, no hot tubs      for one week.   HISTORY OF PRESENT ILLNESS:  A 50 year old female with history of COPD,  gastroesophageal reflux disease and degenerative joint disease, was seen by  Dr. Domingo Sep for chest discomfort earlier in May.  Found it typical for  angina, located in  the midsubsternal region with radiation to the arms and  back associated with nausea and occasional emesis, shortness of breath  progressive over one year.  Echo was essentially normal.  Dobutamine  Cardiolite negative for ischemia, EF of 70%.  She continued with discomfort  and also nausea and vomiting.  She had been given nitroglycerin to see if  this helped.  It did resolve her chest pain, and she presents with recurrent  chest pain.  She is here for cardiac catheterization.   ALLERGIES:  PENICILLIN, CODEINE and SULFA.   Social history, family history, review of systems, outpatient medications:  See H&P.   PHYSICAL EXAMINATION PRIOR TO DISCHARGE:  VITAL SIGNS:  Stable.  CHEST:  Lungs were clear.  ABDOMEN:  Groin site stable, Angio-Seal.  CARDIAC:  Heart and lungs clear, no changes.  HOSPITAL STAY:  The patient was brought in on Oct 30, 2004, through the  emergency room secondary to chest pain and underwent an elective cardiac  catheterization by Dr. Jeanella Cara the same day, which revealed patent coronary  arteries, EF of 55%.  No MR.  Due to the normalcy of her heart, it was felt  she could be discharged home and be treated for gastroesophageal reflux  disease.  She would follow up with her primary care doctor, Susan Potts.  Sudie Bailey, M.D..      Vernona Rieger   LRI/MEDQ  D:  11/30/2004  T:  12/01/2004  Job:  [id number]   cc:   Cristy Hilts. Jacinto Halim, MD  [address]  [place]  [place] [address]  Fax: [phone]   Susan Potts. Sudie Bailey, M.D.  [address] [place], [place] [address]  Fax: [phone]

## 2010-10-23 NOTE — Op Note (Signed)
[facility]  Patient:    Susan Potts, Susan Potts Visit Number: [id number] MRN: [id number]          Service Type: SUR Location: [id number] Attending Physician:  Cristi Loron Dictated by:   Cristi Loron, M.D. Proc. Date: 05/18/01 Admit Date:  05/18/2001                             Operative Report  PREOPERATIVE DIAGNOSES:  L5-S1 degenerative disk disease, spondylosis, lumbago.  POSTOPERATIVE DIAGNOSES:  L5-S1 degenerative disk disease, spondylosis, lumbago.  PROCEDURES: 1. L5-S1 posterior lumbar interbody fusion. 2. Insertion of bilateral 10 mm Tangent cortical bone dowels. 3. Posterior nonsegmental instrumentation with [employer] 90D    titanium pedicles using rods. 4. Posterolateral arthrodesis at L5-S1 with local morcellized autograft bone    and Vitoss bone substitute.  SURGEON:  Cristi Loron, M.D.  ASSISTANT:  Tanya Nones. Jeral Fruit, M.D.  ANESTHESIA:  General endotracheal.  ESTIMATED BLOOD LOSS:  200 cc.  SPECIMENS:  None.  DRAINS:  None.  COMPLICATIONS:  None.  BRIEF HISTORY:  The patient is a 50 year old white female who has suffered from predominantly back pain.  She failed extensive nonsurgical management and was worked up with a lumbar MRI, which demonstrated degenerative disk disease at L5-S1.  This was followed by a lumbar discogram, which demonstrated concordant pain at L5-S1 with no pain response at L4-5.  I discussed the various treatment options with her, including lumbar fusion.  The patient weighed the risks, benefits, and alternatives of surgery and decided to proceed with the lumbar fusion.  DESCRIPTION OF PROCEDURE:  The patient was brought to the operating room by the anesthesia team.  General endotracheal anesthesia was induced.  She was then turned to the prone position on the Holland frame.  The lumbosacral region was then prepared and scrubbed with Betadine solution, and sterile drapes  were applied.  I then injected the area to be incised with Marcaine with epinephrine solution and used a scalpel to make a midline incision over the L5-S1 interspace.  I used electrocautery to dissect out the thoracolumbar fascia and divided the fascia bilaterally, performing a bilateral subperiosteal dissection, stripping the paraspinous musculature from the spinous processes and laminae of L5 and the upper sacrum bilaterally.  I inserted the McCullough retractor for exposure and then obtained the intraoperative radiograph to confirm our location.  I then used the high-speed drill to perform a bilateral L5 laminotomy.  I widened the laminotomy with the Kerrison punch and saved the bone for later use in the fusion process.  I removed the bilateral L5-S1 ligamentum flavum and decompressed the thecal sac and performed a foraminotomy about the bilateral S1 nerve root.  I then removed more bone in the cephalad direction and performed a foraminotomy about the bilateral exiting L5 nerve roots.  I then therefore freed up the thecal sac and S1 nerve root and retracted it medially and incised the L5-S1 intervertebral disk and performed an aggressive diskectomy bilaterally using the pituitary forceps, Epstein, and Scoville curettes.  I then prepared the interspace for the fusion.  I carefully retracted the thecal sac and S1 nerve root medially and then inserted the 8 mm vertebral body spreader on the right side, distracting this space, and then I placed the 10 mm vertebral body spreader on the left side and removed the spreader on the right.  I then prepared the right L5-S1 interspace with the  10 mm cutting curette and then prepared the end plate with a box chisel, carefully retracting the neural structures out of the way.  I then inserted a 10 mm Tangent bone dowel into the right L5-S1 interspace.  I then removed the spreader from the left side and repeated the procedure on the left,  i.e., prepared the vertebral end plates with the cutting chisel, the box cutter, and after I had cleared all the soft tissues from the vertebral end plates, I then packed medially with Vitoss bone substitute and local morcellized autograft bone.  I then inserted the second bone dowel into the left L5-S1 interspace. I inspected the thecal sac and the bilateral L5-S1 nerve roots for any injury, and there was none noted.  Having completed the posterior lumbar interbody fusion, I now turned my attention to the nonsegmental instrumentation.  I used the _____ to expose the bilateral L5 transverse processes, and then I decorticated posterior to the bilateral L5 and S1 pedicles with the high-speed drill.  I then cannulated the pedicles with the _____, tapped the pedicles, and inserted a 7.75 x 40 mm pedicle screw into the bilateral S1 pedicles under fluoroscopic guidance and then inserted 6.75 x 45 mm pedicle screw into the left L5 pedicle under fluoroscopic guidance.  I placed the same size screw in the right L5 pedicle. I then obtained an AP radiograph with the fluoroscopy and thought the right L5 pedicle was slightly lateral and therefore removed the screw and retapped it and placed a 7.75 x 45 mm pedicle screw in the right L5 pedicle under fluoroscopic guidance and then palpated along the medial surface of the bilateral L5-S1 pedicles and noted that the screws were now palpable and the bilateral L5-S1 nerve roots were not injured.  I then connected the lateral pedicle screws with a rod (I cut a 70 mm rod in half) and then secured the rod in place with the appropriate caps, completing the construct.  I now turned my attention to the posterolateral arthrodesis.  I used a high-speed drill to decorticate the L5 pars region, the L5-S1 facet region, and the S1 lamina as well as the bilateral transverse processes, and I laid a combination of local morcellized autograft bone and the Vitoss bone  substitute to the decorticated posterolateral structures, completing the posterolateral arthrodesis.   I now achieved stringent hemostasis using bipolar electrocautery.  I copiously irrigated the wound with bacitracin solution, removed the solution, and then inspected the thecal sac and the bilateral L5 and S1 nerve roots for any compression, and there was none.  I then removed the McCullough retractor and reapproximated the patients thoracolumbar fascia with interrupted #1 Vicryl suture, subcutaneous tissues with interrupted 2-0 Vicryl suture, and the skin with Steri-Strips and benzoin.  The wound was then coated with bacitracin ointment and sterile dressings were applied.  The drapes were removed.  The patient was subsequently returned to the supine position and then extubated by the anesthesia team.  She was transported to the postanesthesia care unit in stable condition.  All sponge, instrument, and needle counts were correct at the end of the case. Dictated by:   Cristi Loron, M.D. Attending Physician:  Tressie Stalker D DD:  05/18/01 TD:  05/19/01 Job: [id number]

## 2010-10-23 NOTE — Cardiovascular Report (Signed)
[name], [name]              ACCOUNT NO.:  [id number]   MEDICAL RECORD NO.:  [id number]          PATIENT TYPE:  INP   LOCATION:  3731                         FACILITY:  [facility]   PHYSICIAN:  Cristy Hilts. Jacinto Halim, MD       DATE OF BIRTH:  02/08/1961   DATE OF PROCEDURE:  10/30/2004  DATE OF DISCHARGE:                              CARDIAC CATHETERIZATION   PROCEDURE PERFORMED:  1.  Left ventriculography.  2.  Selective right and left coronary arteriography.  3.  Abdominal aortogram.  4.  Right femoral angiography and closure of the right femoral arterial      access with StarClose.   INDICATION:  Ms. Susan Potts is a 50 year old female with history of  chronic obstructive pulmonary disease, gastroesophageal reflux disease,  degenerative joint disease with recurrent chest discomfort.  She was  recently seen in our outpatient office with complaint of rest pain.  Given  this, she was directly admitted to the hospital for cardiac catheterization.   HEMODYNAMIC DATA:  1.  The left ventricular pressures were 103/0 with end-diastolic pressure of      6 mmHg.  2.  Aortic pressure 103/64 with a mean of 82 mmHg.  3.  There was no pressure gradient across the aortic valve.   ANGIOGRAPHIC DATA:  Left ventricle:  Left ventricular systolic function was  normal with ejection fraction of 65%.  There is no significant mitral  regurgitation, and there was no wall motion abnormality.   Right coronary artery:  The right coronary artery is a large caliber vessel.  It is normal.  It gives origin to a moderate size PDA and a very large PLA.   Left main:  Left main is a large caliber vessel.  It is normal.   Left anterior descending artery:  The LAD is a large vessel, gives origin to  a moderate size diagonal one and two small distal diagonals.  It is normal.   Circumflex:  Circumflex is a large caliber vessel.  It gives origin to large  obtuse marginal one and continues as OM-2.  It is normal.  It  is mildly  tortuous.   Ramus intermediate:  Ramus intermediate is a large caliber vessel.  It is  normal.   ABDOMINAL AORTOGRAM:  Abdominal aortogram revealed presence of two renal  arteries; one on either side.  They are widely patent.  There is no evidence  of abdominal aortic aneurysm.   FINAL IMPRESSION:  1.  Normal left ventricular systolic function with ejection fraction of 65%.      No significant mitral regurgitation.  2.  Normal left ventricular end-diastolic pressure and hemodynamics.  3.  Normal coronary arteries, right dominant circulation.  4.  Normal renal arteries.   RECOMMENDATIONS:  Evaluation for noncardiac cause of chest pain is  indicated.  The patient has been advised to stop smoking.  Evaluation for GI  cause may be indicated.   A total of 125 mL of contrast was utilized for diagnostic angiography.   TECHNIQUE OF PROCEDURE:  Under usual sterile precautions, using a 6 French  right femoral arterial  access, a 6 [place] multipurpose B2 catheter was  utilized to perform the left heart catheterization.  It was passed over into  the ascending aorta over a 0.035 inch J wire.  The catheter was gently  advanced to the left ventricle and left ventricular pressures were  monitored.  Hand contrast injection of the left ventricle was performed both  in the LAO and RAO projection.  The catheter was flushed with saline and  pulled back into the ascending aorta and pressure gradient across the aortic  valve was monitored.  The right coronary artery was selectively engaged and  angiography was performed.  Because of catheter-induced spasm, the catheter  was pulled out and 200 mcg of intracoronary nitroglycerin was administered,  and the catheter was utilized to engage the left main coronary artery and  angiography was performed.  Then, the  catheter was pulled back into the  abdominal aorta and abdominal aortogram was performed.  Catheter was then  pulled out of the body and  a 6 French Judkins right 4 diagnostic catheter  was utilized to engage the right coronary artery and angiography was  repeated.  The catheter was pulled out of the body in the usual fashion.  Right femoral angiography was performed through the arterial access sheath,  and the access was closed with StarClose with excellent hemostasis obtained.  The patient tolerated the procedure well.  No immediate complications noted.       JRG/MEDQ  D:  10/30/2004  T:  10/30/2004  Job:  [id number]   cc:   Dani Gobble, MD  Fax: [phone]

## 2010-10-23 NOTE — Op Note (Signed)
NAME:  Susan Potts, Susan Potts.                      ACCOUNT NO.:  [id number]   MEDICAL RECORD NO.:  [id number]                   PATIENT TYPE:   LOCATION:                                       FACILITY:   PHYSICIAN:  R. Roetta Sessions, M.D.              DATE OF BIRTH:   DATE OF PROCEDURE:  09/30/2003  DATE OF DISCHARGE:                                 OPERATIVE REPORT   PROCEDURE:  Diagnostic esophagogastroduodenoscopy.   ENDOSCOPIST:  Gerrit Friends. Rourk, M.D.   INDICATIONS FOR PROCEDURE:  The patient is a 50 year old lady with chronic  gastroesophageal reflux disease.  She also has frequent nausea and vomiting.  She really has not had any abdominal pain.  She has symptoms in spite of  taking Reglan 10 mg t.i.d. and Prevacid 30 mg orally b.i.d.  She recently  took a course of Prevpac for H. pylori infection.  She has a history of  large hiatal hernia seen on previous EGD.  EGD is now being done.  This  approach has been discussed with the patient at length.  The potential  risks, benefits, and alternatives have been reviewed.  Please see my September 25, 2003 consultation note.   PROCEDURE NOTE:  O2 saturation, blood pressure, pulse and respirations were  monitored throughout the entire procedure.  Conscious sedation: Versed 5 mg  IV, Demerol 125 mg IV in divided doses.   INSTRUMENT:  Olympus GIF-140 gastroscope.   FINDINGS:  Examination of the tubular esophagus revealed no mucosal  abnormalities.  The EG junction was easily traversed.   STOMACH:  The gastric cavity had quite a bit of bile-stained mucus within  it.  It insufflated well with air.  The bile stained mucus was washed away.  A thorough examination of the gastric mucosa including a retroflex view of  the proximal stomach and esophagogastric junction was undertaken.  The  mucosa appeared normal.  I was unable to identify a hiatal hernia.  The  pylorus was patent and easily traversed.  Examination of the bulb and second  portion  revealed no abnormalities.   THERAPEUTIC/DIAGNOSTIC MANEUVERS:  None.   The patient tolerated the procedure well and was reacted in endoscopy.   IMPRESSION:  1. Normal esophagus.  2. Bile-stained gastric mucus, otherwise normal stomach.  3. Normal D1, D2.   RECOMMENDATIONS:  1. We will pursue a solid phase gastric emptying study to evaluate further     for delayed gastric emptying. She will be allowed to continue her Reglan     while this study is performed.  2. Further recommendations to follow in the future.      ___________________________________________                                            Jonathon Bellows, M.D.   RMR/MEDQ  D:  09/30/2003  T:  09/30/2003  Job:  [id number]   cc:   Mila Homer. Sudie Bailey, M.D.  [address] [place], [place] [address]  Fax: [phone]   R. Roetta Sessions, M.D.  [address]  [place]  [place] [address]  Fax: [phone]

## 2010-10-23 NOTE — Consult Note (Signed)
NAME:  Susan Potts, Susan Potts                         ACCOUNT NO.:  [id number]   MEDICAL RECORD NO.:  [id number]                  PATIENT TYPE:   LOCATION:                                       FACILITY:   PHYSICIAN:  R. Roetta Sessions, M.D.              DATE OF BIRTH:  09/08/1916   DATE OF CONSULTATION:  DATE OF DISCHARGE:                                   CONSULTATION   GASTROINTESTINAL CONSULT   REQUESTING PHYSICIAN:  Mila Homer. Sudie Bailey, M.D.   REASON FOR CONSULTATION:  Chronic GERD.   HISTORY OF PRESENT ILLNESS:  This patient is a 50 year old Caucasian female  who presents to our office with a greater than 5-year history of chronic  GERD. She notes that it is increasingly worse over the past year or two. She  has been on Reglan for 5 years as well as Prevacid for 5 years.  She is  currently on Prevacid 30 mg b.i.d. She is complaining of persistent nausea  as well as vomiting.  Last EGD was by [facility] in [place] in 2002.  She  does report large hiatal hernia; however, I do not have the report from our  review.  She also states that she had a colonoscopy, at the same time, which  was normal.  She denies any dysphagia or odynophagia.  She does report  history of positive H. pylori treated with Prevpac in the past.  She reports  her bowel movements have been soft, brown and normal without any melena,  rectal bleeding, or mucus.  She denies any diarrhea or constipation. She  denies any aspirin or NSAID use.   PAST MEDICAL HISTORY:  1. Reported large hiatal hernia diagnosed on EGD in 2002 by [facility] (do not     have records).  2. Iron-deficiency anemia secondary to heavy menses.  3. COPD.  4. Gastroparesis.  5. Chronic back pain.   PAST SURGICAL HISTORY:  1. Back surgery.  2. C-section, tubal ligation in 1995.   CURRENT MEDICATIONS:  1. Reglan 10 mg t.i.d.  2. Prevacid 30 mg b.i.d.  3. Phenergan 25 mg suppositories q.4h. p.r.n.  4. Ultram 50 mg 2 tablets q.6h. p.r.n.  5.  Valium 5 mg t.i.d.  6. Foradil b.i.d.  7. Maxitrol inhaler p.r.n.  8. Pulmicort inhaler p.r.n.   ALLERGIES:  CODEINE and SULFA.   FAMILY HISTORY:  No known family history of colorectal carcinoma.  She does  have family history of mother with uterine carcinoma and father is in good  health.  She does have history of maternal grandmother with unknown  carcinoma.  Two healthy brothers.   SOCIAL HISTORY:  This patient is single and lives with her 2 daughters ages  1 and 61 both of whom are healthy.  She is disabled secondary to chronic  back problems.  She reports a 35+ history of 3-pack-per-day tobacco use  quitting 10 days ago.  She denies any alcohol  or drug use.   REVIEW OF SYSTEMS:  CONSTITUTIONAL:  Weight is steadily increasing.  She  notes a 12-pound increase over the past several months.  Denies any fever.  Does have occasional chills.  Appetite is good; however, she is afraid to  eat secondary to nausea. HEMATOLOGICAL:  She does have history of iron-  deficiency anemia which she believes was diagnosed 2-3 years ago.  GYN:  She  has had tubal ligation.  LMP approximately 2 months ago.  She is complaining  of amenorrhea; however, she is status post tubal ligation in 1995 and has  not been sexually active in the last 4 years.  GI:  See HPI.   PHYSICAL EXAMINATION:  VITAL SIGNS:  Weight 167.5 pounds.  Height 66-1/2  inches. Temperature 98.6, blood pressure 110/70, pulse 90.  GENERAL:  This is a well-developed, well-nourished, Caucasian female who  appears older than her stated age.  She is alert, oriented, pleasant and  cooperative.  HEENT:  Sclerae are clear.  Nonicteric.  Conjunctivae pink. Oropharynx pink  and moist without any lesions.  NECK:  Supple without any masses or thyromegaly.  HEART:  Regular rate and rhythm with normal S1-S2 without any murmurs,  clicks, rubs or gallops.  LUNGS:  With decreased breath sounds bilaterally and emphysematous changes  throughout.   ABDOMEN:  Flat with positive bowel sounds x4.  Soft, nontender, nondistended  without any palpable mass or hepatosplenomegaly.  Negative Murphy's sign.  EXTREMITIES:  Without edema; good pedal pulses bilaterally.  LABORATORY STUDIES:  Hepatitis A, B, and C markers negative from March 26, 2003.  CBC from  March 20, 2003 WBC 5.2, hemoglobin 13.5, hematocrit 40, platelets 181,  sedimentation rate 1, sodium 134, potassium 4, chloride 104, CO2 23, glucose  115, BUN 6, creatinine 0.6.   ASSESSMENT:  This patient is a 50 year old Caucasian female with chronic  GERD.  She may also have an element of reflux esophagitis as well.  Would  recommend further evaluation, at this point in time, to evaluate for above  as well as look for complications of chronic reflux including Barrett's  esophagus.  Also, of note, would pursue gallbladder workup if this has not  already been done pending EGD.   RECOMMENDATIONS:  1. I have discussed risks and benefits which include, but are not limited to     but including bleeding, infection, perforation, and drug reaction with     the patient. She agrees with this plan.  Consent will be obtained.  2. Continue PPI b.i.d. She is now on Prevacid 30 mg b.i.d.  3. Continue Reglan.  4. Prescription was given for Phenergan suppositories 25 mg 1 per rectum q.6-     8h.  p.r.n. severe nausea.  5. Would consider LFTs and abdominal ultrasound if indicated post EGD.   We would like to thank Dr. Sudie Bailey for allowing [place] to participate in the  care of this patient.     ________________________________________  ___________________________________________  [place] Lose, N.P.                  Jonathon Bellows, M.D.   KC/MEDQ  D:  09/25/2003  T:  09/26/2003  Job:  [id number]   cc:   R. Roetta Sessions, M.D.  [address]  [place]  [place] [address]  Fax: [phone]   Mila Homer. Sudie Bailey, M.D.  [address] [place], [place] [address]  Fax: [phone]

## 2010-10-29 ENCOUNTER — Encounter: Payer: Self-pay | Admitting: Cardiology

## 2010-11-03 ENCOUNTER — Ambulatory Visit: Payer: Medicare HMO | Admitting: Physical Therapy

## 2010-12-10 ENCOUNTER — Other Ambulatory Visit: Payer: Self-pay | Admitting: Cardiology

## 2010-12-10 MED ORDER — DILTIAZEM HCL 120 MG PO TABS: 120.0000 mg | ORAL_TABLET | Freq: Every day | ORAL | Status: DC

## 2010-12-10 NOTE — Telephone Encounter (Signed)
RX CALLED IN

## 2010-12-10 NOTE — Telephone Encounter (Signed)
Patient needs refill on Taizac 120mg   Called to [facility] / tg

## 2010-12-24 ENCOUNTER — Encounter: Payer: Self-pay | Admitting: Cardiology

## 2010-12-28 ENCOUNTER — Encounter: Payer: Self-pay | Admitting: Cardiology

## 2010-12-28 ENCOUNTER — Ambulatory Visit: Payer: Medicare HMO | Admitting: Cardiology

## 2011-01-25 ENCOUNTER — Other Ambulatory Visit: Payer: Self-pay

## 2011-01-25 ENCOUNTER — Emergency Department (HOSPITAL_COMMUNITY)
Admission: EM | Admit: 2011-01-25 | Discharge: 2011-01-25 | Disposition: A | Discharge status: Home / Self Care | Payer: Medicare HMO | Attending: Emergency Medicine | Admitting: Emergency Medicine

## 2011-01-25 ENCOUNTER — Encounter (HOSPITAL_COMMUNITY): Payer: Self-pay | Admitting: Emergency Medicine

## 2011-01-25 ENCOUNTER — Emergency Department (HOSPITAL_COMMUNITY): Payer: Medicare HMO

## 2011-01-25 DIAGNOSIS — IMO0001 Reserved for inherently not codable concepts without codable children: Secondary | ICD-10-CM | POA: Insufficient documentation

## 2011-01-25 DIAGNOSIS — J40 Bronchitis, not specified as acute or chronic: Secondary | ICD-10-CM

## 2011-01-25 DIAGNOSIS — F17200 Nicotine dependence, unspecified, uncomplicated: Secondary | ICD-10-CM | POA: Insufficient documentation

## 2011-01-25 DIAGNOSIS — E785 Hyperlipidemia, unspecified: Secondary | ICD-10-CM | POA: Insufficient documentation

## 2011-01-25 DIAGNOSIS — K219 Gastro-esophageal reflux disease without esophagitis: Secondary | ICD-10-CM | POA: Insufficient documentation

## 2011-01-25 DIAGNOSIS — J4489 Other specified chronic obstructive pulmonary disease: Secondary | ICD-10-CM | POA: Insufficient documentation

## 2011-01-25 DIAGNOSIS — Z79899 Other long term (current) drug therapy: Secondary | ICD-10-CM | POA: Insufficient documentation

## 2011-01-25 DIAGNOSIS — J449 Chronic obstructive pulmonary disease, unspecified: Secondary | ICD-10-CM | POA: Insufficient documentation

## 2011-01-25 HISTORY — DX: Chronic obstructive pulmonary disease, unspecified: J44.9

## 2011-01-25 LAB — BASIC METABOLIC PANEL
Chloride: 101 mEq/L (ref 96–112)
GFR calc Af Amer: >60 mL/min (ref >60)
Potassium: 3.9 mEq/L (ref 3.5–5.1)
Sodium: 137 mEq/L (ref 135–145)

## 2011-01-25 LAB — DIFFERENTIAL
Eosinophils Relative: 1 % (ref 0–5)
Lymphocytes Relative: 23 % (ref 12–46)
Lymphs Abs: 1.8 10*3/uL (ref 0.7–4.0)
Monocytes Absolute: 0.5 10*3/uL (ref 0.1–1.0)
Neutro Abs: 5.5 10*3/uL (ref 1.7–7.7)

## 2011-01-25 LAB — CBC
HCT: 45.2 % (ref 36.0–46.0)
Hemoglobin: 15.5 g/dL — ABNORMAL HIGH (ref 12.0–15.0)
MCV: 94.2 fL (ref 78.0–100.0)
RBC: 4.8 MIL/uL (ref 3.87–5.11)
WBC: 8 10*3/uL (ref 4.0–10.5)

## 2011-01-25 LAB — CARDIAC PANEL(CRET KIN+CKTOT+MB+TROPI)
CK, MB: 2.4 ng/mL (ref 0.3–4.0)
Total CK: 52 U/L (ref 7–177)
Troponin I: <0.3 ng/mL (ref <0.30)

## 2011-01-25 LAB — D-DIMER, QUANTITATIVE: D-Dimer, Quant: 0.31 ug/mL-FEU (ref 0.00–0.48)

## 2011-01-25 MED ORDER — AZITHROMYCIN 250 MG PO TABS: 250.0000 mg | ORAL_TABLET | Freq: Every day | ORAL | Status: AC

## 2011-01-25 NOTE — ED Notes (Signed)
Patient c/o cough and chest congestion that started 3 days ago. Patient reports hx of COPD. Patient denies any fevers but reports pain bilaterally uder ribs from coughing and being slightly short of breath. Per patient thick white sputum-not observed at this time. O2 sats 97% on room air.

## 2011-01-25 NOTE — ED Provider Notes (Signed)
History    Scribed for Forbes Cellar, MD, the patient was seen in room APA06/APA06. This chart was scribed by Clarita Crane. This patient's care was started at 10:36AM.  CSN: [id number] Arrival date & time: 01/25/2011 10:08 AM  Chief Complaint  Patient presents with  . Chest pain  Productive Cough   HPI Susan Potts is a 50 y.o. female who presents to the Emergency Department complaining of productive cough with white sputum onset 3 days ago and persistent since with new onset of constant chest pain to bilateral sides of chest described as burning and mild SOB yesterday. Denies fever, lower extremity pain and swelling of lower extremities. Patient notes chest pain is not similar to gastric reflux but is similar to previous episode of pleurisy. Reports chest pain is aggravated by coughing and deep breathing. Patient with h/o COPD, asthma and SVT. Family h/o blood clots (mother). Denies recent surgery (last surgery in March 2012- 5 months ago).   HPI ELEMENTS: Chest pain Location: bilateral sides of chest Onset: yesterday Duration: persistent since onset Timing: constant  Quality: burning   Modifying factors: aggravated by deep breathing and coughing  Context:  as above  Associated symptoms: +productive cough with white sputum, mild SOB. Denies fever, lower extremity pain and swelling of lower extremities  PAST MEDICAL HISTORY:  Past Medical History  Diagnosis Date  . PSVT (paroxysmal supraventricular tachycardia)     normal coronary angiography in 2006; records from EMS documented supraventricular tachycardia at approximately 195 bpm on 05/10/10  . Fibromyalgia   . Degenerative disc disease     LS spine discectomy and fusion; parasternal rod  . Carpal tunnel syndrome   . Hyperlipidemia   . Ulnar neuritis   . Tobacco abuse     40 pack years; one pack per day  . Gastroesophageal reflux disease   . COPD (chronic obstructive pulmonary disease)     PAST SURGICAL HISTORY:  Past  Surgical History  Procedure Date  . Lumbar disc surgery 07/2010  . Ganglion cyst excision 2009    Right wrist  . Cesarean section 1995  . Tubal ligation 1995  . Carpal tunnel release 2009    Right  . Spinal fixation surgery w/ implant     L4, L5    MEDICATIONS:  Previous Medications   ALPRAZOLAM (XANAX) 0.25 MG TABLET    Take 0.25 mg by mouth at bedtime as needed.     CYCLOBENZAPRINE (FLEXERIL) 10 MG TABLET    Take 10 mg by mouth 3 (three) times daily as needed.    DIAZEPAM (VALIUM) 5 MG TABLET    Take 5 mg by mouth every 6 (six) hours as needed.     DILTIAZEM (CARDIZEM) 120 MG TABLET    Take 1 tablet (120 mg total) by mouth daily.   DULOXETINE (CYMBALTA) 60 MG CAPSULE    Take 60 mg by mouth daily.     HYDROCODONE-ACETAMINOPHEN (NORCO) 10-325 MG PER TABLET    Take 1 tablet by mouth 4 (four) times daily.     METHOCARBAMOL (ROBAXIN) 750 MG TABLET    Take 750 mg by mouth 3 (three) times daily.     ONDANSETRON (ZOFRAN) 8 MG TABLET    Take 8 mg by mouth every 8 (eight) hours as needed. For nausea    OXYCODONE-ACETAMINOPHEN (PERCOCET) 10-325 MG PER TABLET    Take 1 tablet by mouth every 4 (four) hours as needed.     PANTOPRAZOLE (PROTONIX) 40 MG TABLET    Take  40 mg by mouth daily.     PROMETHAZINE (PHENERGAN) 25 MG TABLET    Take 25 mg by mouth every 6 (six) hours as needed.     PROMETHAZINE-CODEINE (PHENERGAN WITH CODEINE) 6.25-10 MG/5ML SYRUP    Take by mouth as needed.     PSEUDOEPHEDRINE-DM-GG (ROBITUSSIN CF PO)    Take 10 mLs by mouth 2 (two) times daily as needed. Take 2 teaspoons as needed for cough      ALLERGIES:  Allergies as of 01/25/2011 - Review Complete 01/25/2011  Allergen Reaction Noted  . Sulfonamide derivatives Anaphylaxis and Rash      FAMILY HISTORY:  Family History  Problem Relation Age of Onset  . Arthritis Other   . Stroke Other   . Cancer Mother   . Diabetes Mother   . Hypertension Mother   . Thyroid disease Mother   . Diabetes Brother   . Hypertension  Brother      SOCIAL HISTORY: History   Social History  . Marital Status: Single    Spouse Name: N/A    Number of Children: N/A  . Years of Education: N/A   Occupational History  . Unemployed     Disabled   Social History Main Topics  . Smoking status: Current Everyday Smoker -- 1.0 packs/day for 43 years    Types: Cigarettes  . Smokeless tobacco: Never Used  . Alcohol Use: No  . Drug Use: No  . Sexually Active: No   Other Topics Concern  . None   Social History Narrative   Divorced.    OB History    Grav Para Term Preterm Abortions TAB SAB Ect Mult Living   2 2 2       2       Review of Systems 10 Systems reviewed and are negative for acute change except as noted in the HPI.  Physical Exam  BP 113/62  Pulse 69  Temp(Src) 97.9 F (36.6 C) (Oral)  Resp 20  Ht 5\' 7"  (1.702 m)  Wt 179 lb (81.194 kg)  BMI 28.04 kg/m2  SpO2 98%  Physical Exam  Nursing note and vitals reviewed. Constitutional: She is oriented to person, place, and time. She appears well-developed and well-nourished. No distress.  HENT:  Head: Normocephalic and atraumatic.  Mouth/Throat: Oropharynx is clear and moist.  Eyes: EOM are normal. Pupils are equal, round, and reactive to light.  Neck: Neck supple.  Cardiovascular: Normal rate and regular rhythm.  Exam reveals no gallop and no friction rub.   No murmur heard. Pulmonary/Chest: Effort normal and breath sounds normal. She has no wheezes.  Abdominal: Soft. Bowel sounds are normal. She exhibits no distension. There is no tenderness.  Musculoskeletal: Normal range of motion. She exhibits no edema.  Neurological: She is alert and oriented to person, place, and time. No sensory deficit.  Skin: Skin is warm and dry.  Psychiatric: She has a normal mood and affect. Her behavior is normal.    ED Course  Procedures  OTHER DATA REVIEWED: Nursing notes, vital signs, and past medical records reviewed. Lab results reviewed and  considered Imaging results reviewed and considered  DIAGNOSTIC STUDIES: Oxygen Saturation is 96% on room air, normal by my interpretation.    LABS / RADIOLOGY: Results for orders placed during the hospital encounter of 01/25/11  CBC      Component Value Range   WBC 8.0  4.0 - 10.5 (K/uL)   RBC 4.80  3.87 - 5.11 (MIL/uL)   Hemoglobin 15.5 (*)  12.0 - 15.0 (g/dL)   HCT 04.5  40.9 - 81.1 (%)   MCV 94.2  78.0 - 100.0 (fL)   MCH 32.3  26.0 - 34.0 (pg)   MCHC 34.3  30.0 - 36.0 (g/dL)   RDW 91.4  78.2 - 95.6 (%)   Platelets 197  150 - 400 (K/uL)  DIFFERENTIAL      Component Value Range   Neutrophils Relative 69  43 - 77 (%)   Neutro Abs 5.5  1.7 - 7.7 (K/uL)   Lymphocytes Relative 23  12 - 46 (%)   Lymphs Abs 1.8  0.7 - 4.0 (K/uL)   Monocytes Relative 7  3 - 12 (%)   Monocytes Absolute 0.5  0.1 - 1.0 (K/uL)   Eosinophils Relative 1  0 - 5 (%)   Eosinophils Absolute 0.1  0.0 - 0.7 (K/uL)   Basophils Relative 0  0 - 1 (%)   Basophils Absolute 0.0  0.0 - 0.1 (K/uL)  BASIC METABOLIC PANEL      Component Value Range   Sodium 137  135 - 145 (mEq/L)   Potassium 3.9  3.5 - 5.1 (mEq/L)   Chloride 101  96 - 112 (mEq/L)   CO2 26  19 - 32 (mEq/L)   Glucose, Bld 113 (*) 70 - 99 (mg/dL)   BUN 12  6 - 23 (mg/dL)   Creatinine, Ser 2.13  0.50 - 1.10 (mg/dL)   Calcium 9.9  8.4 - 08.6 (mg/dL)   GFR calc non Af Amer >60  >60 (mL/min)   GFR calc Af Amer >60  >60 (mL/min)  D-DIMER, QUANTITATIVE      Component Value Range   D-Dimer, Quant 0.31  0.00 - 0.48 (ug/mL-FEU)  CARDIAC PANEL(CRET KIN+CKTOT+MB+TROPI)      Component Value Range   Total CK 52  7 - 177 (U/L)   CK, MB 2.4  0.3 - 4.0 (ng/mL)   Troponin I <0.30  <0.30 (ng/mL)   Relative Index RELATIVE INDEX IS INVALID  0.0 - 2.5    Dg Chest 2 View  01/25/2011  *RADIOLOGY REPORT*  Clinical Data: Cough and congestion.  CHEST - 2 VIEW  Comparison: 12/04/2011and 08/15/2008  Findings: The lungs are clear without focal infiltrate, edema,  pneumothorax or pleural effusion.  Calcified granuloma in the right midlung is unchanged since 08/15/2008 The cardiopericardial silhouette is within normal limits for size. Imaged bony structures of the thorax are intact.  IMPRESSION: Stable.  No acute findings.  Original Report Authenticated By: ERIC A. MANSELL, M.D.    PROCEDURES:  ED COURSE / COORDINATION OF CARE: Orders Placed This Encounter  Procedures  . DG Chest 2 View  . CBC  . Differential  . Basic metabolic panel  . D-dimer, quantitative  . Cardiac panel(cret kin+cktot+mb+tropi)  . EKG test    Date: 01/25/2011  Rate: 65   Rhythm: normal sinus rhythm  QRS Axis: normal  Intervals: normal  ST/T Wave abnormalities: t wave inversion V3 only  Conduction Disutrbances:none RSR' qrs wnl  Narrative Interpretation:   Old EKG Reviewed: none available  MDM: Differential Diagnosis: bronchitis, pneumonia, pleurisy less likely PE, ischemia   PLAN: Labs reviewed and unremarkable incl d dimer. CXR without pna. Will treat as bronchitis. Albuterol inh prn, z pack. The patient is to return the emergency department if there is any worsening of symptoms. I have reviewed the discharge instructions with the patient/family  CONDITION ON DISCHARGE: Stable   MEDICATIONS GIVEN IN THE E.D.  Medications  HYDROcodone-acetaminophen (  NORCO) 10-325 MG per tablet (not administered)  methocarbamol (ROBAXIN) 750 MG tablet (not administered)  ondansetron (ZOFRAN) 8 MG tablet (not administered)  Pseudoephedrine-DM-GG (ROBITUSSIN CF PO) (not administered)  azithromycin (ZITHROMAX) 250 MG tablet (not administered)     I personally performed the services described in this documentation, which was scribed in my presence. The recorded information has been reviewed and considered. Forbes Cellar, MD    Forbes Cellar, MD 01/25/11 [phone]

## 2011-02-05 ENCOUNTER — Emergency Department (HOSPITAL_COMMUNITY): Payer: Medicare HMO

## 2011-02-05 ENCOUNTER — Encounter (HOSPITAL_COMMUNITY): Payer: Self-pay | Admitting: Emergency Medicine

## 2011-02-05 ENCOUNTER — Emergency Department (HOSPITAL_COMMUNITY)
Admission: EM | Admit: 2011-02-05 | Discharge: 2011-02-05 | Disposition: A | Discharge status: Home / Self Care | Payer: Medicare HMO | Attending: Emergency Medicine | Admitting: Emergency Medicine

## 2011-02-05 DIAGNOSIS — J4489 Other specified chronic obstructive pulmonary disease: Secondary | ICD-10-CM | POA: Insufficient documentation

## 2011-02-05 DIAGNOSIS — F17200 Nicotine dependence, unspecified, uncomplicated: Secondary | ICD-10-CM | POA: Insufficient documentation

## 2011-02-05 DIAGNOSIS — M51379 Other intervertebral disc degeneration, lumbosacral region without mention of lumbar back pain or lower extremity pain: Secondary | ICD-10-CM | POA: Insufficient documentation

## 2011-02-05 DIAGNOSIS — M549 Dorsalgia, unspecified: Secondary | ICD-10-CM | POA: Insufficient documentation

## 2011-02-05 DIAGNOSIS — IMO0001 Reserved for inherently not codable concepts without codable children: Secondary | ICD-10-CM | POA: Insufficient documentation

## 2011-02-05 DIAGNOSIS — M5137 Other intervertebral disc degeneration, lumbosacral region: Secondary | ICD-10-CM | POA: Insufficient documentation

## 2011-02-05 DIAGNOSIS — K219 Gastro-esophageal reflux disease without esophagitis: Secondary | ICD-10-CM | POA: Insufficient documentation

## 2011-02-05 DIAGNOSIS — R51 Headache: Secondary | ICD-10-CM | POA: Insufficient documentation

## 2011-02-05 DIAGNOSIS — R209 Unspecified disturbances of skin sensation: Secondary | ICD-10-CM | POA: Insufficient documentation

## 2011-02-05 DIAGNOSIS — W1789XA Other fall from one level to another, initial encounter: Secondary | ICD-10-CM | POA: Insufficient documentation

## 2011-02-05 DIAGNOSIS — J449 Chronic obstructive pulmonary disease, unspecified: Secondary | ICD-10-CM | POA: Insufficient documentation

## 2011-02-05 MED ORDER — DEXAMETHASONE 6 MG PO TABS: ORAL_TABLET | ORAL | Status: DC

## 2011-02-05 MED ORDER — ONDANSETRON HCL 4 MG PO TABS
4.0000 mg | ORAL_TABLET | Freq: Once | ORAL | Status: AC
  Administered 2011-02-05: 4 mg via ORAL
  Filled 2011-02-05: qty 1

## 2011-02-05 MED ORDER — DIAZEPAM 5 MG PO TABS
10.0000 mg | ORAL_TABLET | Freq: Once | ORAL | Status: AC
  Administered 2011-02-05: 10 mg via ORAL
  Filled 2011-02-05 (×2): qty 1

## 2011-02-05 MED ORDER — HYDROCODONE-ACETAMINOPHEN 5-325 MG PO TABS
2.0000 | ORAL_TABLET | Freq: Once | ORAL | Status: AC
  Administered 2011-02-05: 2 via ORAL
  Filled 2011-02-05: qty 2

## 2011-02-05 NOTE — ED Provider Notes (Signed)
History     CSN: [id number] Arrival date & time: 02/05/2011  8:53 AM  Chief Complaint  Patient presents with  . Back Pain   HPI Comments: Pt was swinging in a swing nearly a week ago. She fell and injured the lower back. Hx of chronic back pain, and lumbar fusion.  Patient is a 50 y.o. female presenting with back pain. The history is provided by the patient.  Back Pain  This is a new problem. The current episode started more than 1 week ago. The problem occurs constantly. The problem has been gradually worsening. The pain is associated with falling. The pain is present in the lumbar spine. The quality of the pain is described as aching. The pain is at a severity of 8/10. The pain is severe. The symptoms are aggravated by certain positions. The pain is the same all the time. Stiffness is present all day. Associated symptoms include headaches and paresthesias. Pertinent negatives include no chest pain, no abdominal pain, no bowel incontinence, no perianal numbness, no bladder incontinence and no dysuria. She has tried analgesics and muscle relaxants for the symptoms. The treatment provided no relief. Risk factors: previous back surg.    Past Medical History  Diagnosis Date  . PSVT (paroxysmal supraventricular tachycardia)     normal coronary angiography in 2006; records from EMS documented supraventricular tachycardia at approximately 195 bpm on 05/10/10  . Fibromyalgia   . Degenerative disc disease     LS spine discectomy and fusion; parasternal rod  . Carpal tunnel syndrome   . Hyperlipidemia   . Ulnar neuritis   . Tobacco abuse     40 pack years; one pack per day  . Gastroesophageal reflux disease   . COPD (chronic obstructive pulmonary disease)     Past Surgical History  Procedure Date  . Lumbar disc surgery 07/2010  . Ganglion cyst excision 2009    Right wrist  . Cesarean section 1995  . Tubal ligation 1995  . Carpal tunnel release 2009    Right  . Spinal fixation surgery w/  implant     L4, L5    Family History  Problem Relation Age of Onset  . Arthritis Other   . Stroke Other   . Cancer Mother   . Diabetes Mother   . Hypertension Mother   . Thyroid disease Mother   . Diabetes Brother   . Hypertension Brother     History  Substance Use Topics  . Smoking status: Current Everyday Smoker -- 1.0 packs/day for 43 years    Types: Cigarettes  . Smokeless tobacco: Never Used  . Alcohol Use: No    OB History    Grav Para Term Preterm Abortions TAB SAB Ect Mult Living   2 2 2       2       Review of Systems  Constitutional: Negative for activity change.       All ROS Neg except as noted in HPI  HENT: Negative for nosebleeds and neck pain.   Eyes: Negative for photophobia and discharge.  Respiratory: Negative for cough, shortness of breath and wheezing.   Cardiovascular: Negative for chest pain and palpitations.  Gastrointestinal: Negative for abdominal pain, blood in stool and bowel incontinence.  Genitourinary: Negative for bladder incontinence, dysuria, frequency and hematuria.  Musculoskeletal: Positive for back pain. Negative for arthralgias.  Skin: Negative.   Neurological: Positive for headaches and paresthesias. Negative for dizziness, seizures and speech difficulty.  Psychiatric/Behavioral: Negative for hallucinations and  confusion.    Physical Exam  BP 103/68  Pulse 85  Temp(Src) 98.1 F (36.7 C) (Oral)  Resp 16  Ht 5\' 7"  (1.702 m)  Wt 178 lb (80.74 kg)  BMI 27.88 kg/m2  SpO2 98%  Physical Exam  Nursing note and vitals reviewed. Constitutional: She is oriented to person, place, and time. She appears well-developed and well-nourished.  Non-toxic appearance.  HENT:  Head: Normocephalic.  Right Ear: Tympanic membrane and external ear normal.  Left Ear: Tympanic membrane and external ear normal.  Eyes: EOM and lids are normal. Pupils are equal, round, and reactive to light.  Neck: Normal range of motion. Neck supple. Carotid  bruit is not present.  Cardiovascular: Normal rate, regular rhythm, normal heart sounds, intact distal pulses and normal pulses.   Pulmonary/Chest: Breath sounds normal. No respiratory distress.  Abdominal: Soft. Bowel sounds are normal. There is no tenderness. There is no guarding.  Musculoskeletal: Normal range of motion.       Pain to palpation of the lumbar area. Not hot. No noted bruise or hematoma.  Lymphadenopathy:       Head (right side): No submandibular adenopathy present.       Head (left side): No submandibular adenopathy present.    She has no cervical adenopathy.  Neurological: She is alert and oriented to person, place, and time. She has normal strength. No cranial nerve deficit or sensory deficit.  Skin: Skin is warm and dry.  Psychiatric: Her speech is normal. Her mood appears anxious.    ED Course  Procedures  MDM I have reviewed nursing notes, vital signs, and all appropriate lab and imaging results for this patient.    Results for orders placed during the hospital encounter of 01/25/11  CBC      Component Value Range   WBC 8.0  4.0 - 10.5 (K/uL)   RBC 4.80  3.87 - 5.11 (MIL/uL)   Hemoglobin 15.5 (*) 12.0 - 15.0 (g/dL)   HCT 02.7  25.3 - 66.4 (%)   MCV 94.2  78.0 - 100.0 (fL)   MCH 32.3  26.0 - 34.0 (pg)   MCHC 34.3  30.0 - 36.0 (g/dL)   RDW 40.3  47.4 - 25.9 (%)   Platelets 197  150 - 400 (K/uL)  DIFFERENTIAL      Component Value Range   Neutrophils Relative 69  43 - 77 (%)   Neutro Abs 5.5  1.7 - 7.7 (K/uL)   Lymphocytes Relative 23  12 - 46 (%)   Lymphs Abs 1.8  0.7 - 4.0 (K/uL)   Monocytes Relative 7  3 - 12 (%)   Monocytes Absolute 0.5  0.1 - 1.0 (K/uL)   Eosinophils Relative 1  0 - 5 (%)   Eosinophils Absolute 0.1  0.0 - 0.7 (K/uL)   Basophils Relative 0  0 - 1 (%)   Basophils Absolute 0.0  0.0 - 0.1 (K/uL)  BASIC METABOLIC PANEL      Component Value Range   Sodium 137  135 - 145 (mEq/L)   Potassium 3.9  3.5 - 5.1 (mEq/L)   Chloride 101  96  - 112 (mEq/L)   CO2 26  19 - 32 (mEq/L)   Glucose, Bld 113 (*) 70 - 99 (mg/dL)   BUN 12  6 - 23 (mg/dL)   Creatinine, Ser 5.63  0.50 - 1.10 (mg/dL)   Calcium 9.9  8.4 - 87.5 (mg/dL)   GFR calc non Af Amer >60  >60 (mL/min)  GFR calc Af Amer >60  >60 (mL/min)  D-DIMER, QUANTITATIVE      Component Value Range   D-Dimer, Quant 0.31  0.00 - 0.48 (ug/mL-FEU)  CARDIAC PANEL(CRET KIN+CKTOT+MB+TROPI)      Component Value Range   Total CK 52  7 - 177 (U/L)   CK, MB 2.4  0.3 - 4.0 (ng/mL)   Troponin I <0.30  <0.30 (ng/mL)   Relative Index RELATIVE INDEX IS INVALID  0.0 - 2.5    Dg Chest 2 View  01/25/2011  *RADIOLOGY REPORT*  Clinical Data: Cough and congestion.  CHEST - 2 VIEW  Comparison: 12/04/2011and 08/15/2008  Findings: The lungs are clear without focal infiltrate, edema, pneumothorax or pleural effusion.  Calcified granuloma in the right midlung is unchanged since 08/15/2008 The cardiopericardial silhouette is within normal limits for size. Imaged bony structures of the thorax are intact.  IMPRESSION: Stable.  No acute findings.  Original Report Authenticated By: ERIC A. MANSELL, M.D.   Dg Lumbar Spine Complete  02/05/2011  *RADIOLOGY REPORT*  Clinical Data: Low back and tail bone pain post fall, prior surgery  LUMBAR SPINE - COMPLETE 4+ VIEW  Comparison: 08/05/2010 operative radiographs  Findings: Five non-rib bearing lumbar vertebrae. Prior posterior fusion L5-S1, disc prosthesis appears in place. Bilateral pedicle screws and posterior bars appear intact. Diffuse osseous demineralization. Vertebral body heights maintained without fracture or subluxation. Small superior plate spurs at L4 and L3 with associated disc space narrowing. SI joints symmetric. Broad-based levoconvex lumbar scoliosis apex L2.  IMPRESSION: Prior L5-S1 fusion. Minimal scoliosis and degenerative disc disease changes. No acute osseous abnormalities.  Original Report Authenticated By: Lollie Marrow, M.D.   Dg  Sacrum/coccyx  02/05/2011  *RADIOLOGY REPORT*  Clinical Data: Low back and tail bone pain post fall  SACRUM AND COCCYX - 2+ VIEW  Comparison: None  Findings: Prior L5-S1 fusion. Symmetric SI joints and sacral foramina. No definite sacral coccygeal fracture identified. Small left pelvic phleboliths. Minimal vascular calcification.  IMPRESSION: No acute abnormalities.  Original Report Authenticated By: Lollie Marrow, M.D.     Kathie Dike, [place] 02/05/11 [phone]

## 2011-02-05 NOTE — Progress Notes (Signed)
Pt ambulatory to [facility] with her grand daughter with a cane. Minimal problem with ambulation.

## 2011-02-05 NOTE — ED Notes (Signed)
Patient with c/o fall on Sunday with lower back pain. Patient fell off a swing and landed on her bottom. Patient with history of lower back pain and surgery.

## 2011-02-06 NOTE — ED Provider Notes (Signed)
Medical screening examination/treatment/procedure(s) were performed by non-physician practitioner and as supervising physician I was immediately available for consultation/collaboration.   Benny Lennert, MD 02/06/11 1052

## 2011-02-06 NOTE — Progress Notes (Signed)
  Medical screening examination/treatment/procedure(s) were performed by non-physician practitioner and as supervising physician I was immediately available for consultation/collaboration.     

## 2011-03-10 LAB — URINALYSIS, ROUTINE W REFLEX MICROSCOPIC
Bilirubin Urine: NEGATIVE
Hgb urine dipstick: NEGATIVE
Ketones, ur: NEGATIVE
Protein, ur: NEGATIVE
Specific Gravity, Urine: 1.02
Urobilinogen, UA: 0.2

## 2011-03-10 LAB — URINE CULTURE: Colony Count: NO GROWTH

## 2011-03-10 LAB — URINE MICROSCOPIC-ADD ON

## 2011-03-17 ENCOUNTER — Emergency Department (HOSPITAL_COMMUNITY): Payer: Medicare HMO

## 2011-03-17 ENCOUNTER — Emergency Department (HOSPITAL_COMMUNITY)
Admission: EM | Admit: 2011-03-17 | Discharge: 2011-03-17 | Disposition: A | Discharge status: Home / Self Care | Payer: Medicare HMO | Attending: Emergency Medicine | Admitting: Emergency Medicine

## 2011-03-17 ENCOUNTER — Encounter (HOSPITAL_COMMUNITY): Payer: Self-pay | Admitting: *Deleted

## 2011-03-17 DIAGNOSIS — Z79899 Other long term (current) drug therapy: Secondary | ICD-10-CM | POA: Insufficient documentation

## 2011-03-17 DIAGNOSIS — W19XXXA Unspecified fall, initial encounter: Secondary | ICD-10-CM | POA: Insufficient documentation

## 2011-03-17 DIAGNOSIS — M25559 Pain in unspecified hip: Secondary | ICD-10-CM | POA: Insufficient documentation

## 2011-03-17 DIAGNOSIS — F17200 Nicotine dependence, unspecified, uncomplicated: Secondary | ICD-10-CM | POA: Insufficient documentation

## 2011-03-17 DIAGNOSIS — J4489 Other specified chronic obstructive pulmonary disease: Secondary | ICD-10-CM | POA: Insufficient documentation

## 2011-03-17 DIAGNOSIS — S63501A Unspecified sprain of right wrist, initial encounter: Secondary | ICD-10-CM

## 2011-03-17 DIAGNOSIS — M545 Low back pain, unspecified: Secondary | ICD-10-CM | POA: Insufficient documentation

## 2011-03-17 DIAGNOSIS — I471 Supraventricular tachycardia, unspecified: Secondary | ICD-10-CM | POA: Insufficient documentation

## 2011-03-17 DIAGNOSIS — K219 Gastro-esophageal reflux disease without esophagitis: Secondary | ICD-10-CM | POA: Insufficient documentation

## 2011-03-17 DIAGNOSIS — S63509A Unspecified sprain of unspecified wrist, initial encounter: Secondary | ICD-10-CM | POA: Insufficient documentation

## 2011-03-17 DIAGNOSIS — E785 Hyperlipidemia, unspecified: Secondary | ICD-10-CM | POA: Insufficient documentation

## 2011-03-17 DIAGNOSIS — IMO0002 Reserved for concepts with insufficient information to code with codable children: Secondary | ICD-10-CM | POA: Insufficient documentation

## 2011-03-17 DIAGNOSIS — IMO0001 Reserved for inherently not codable concepts without codable children: Secondary | ICD-10-CM | POA: Insufficient documentation

## 2011-03-17 DIAGNOSIS — J449 Chronic obstructive pulmonary disease, unspecified: Secondary | ICD-10-CM | POA: Insufficient documentation

## 2011-03-17 NOTE — ED Notes (Signed)
Right wrist splint applied

## 2011-03-17 NOTE — ED Provider Notes (Signed)
History     CSN: [id number] Arrival date & time: 03/17/2011  2:02 PM  Chief Complaint  Patient presents with  . Back Pain    lower back  . Wrist Pain    right wrist  . Fall    (Consider location/radiation/quality/duration/timing/severity/associated sxs/prior treatment) HPI Comments: Pt has chronic R hip pain.  She was walkin and the pain became so intense she fell.  Patient is a 50 y.o. female presenting with back pain, wrist pain, and fall. No language interpreter was used.  Back Pain  This is a new problem. Episode onset: fell 5 days ago. The problem occurs constantly. The problem has not changed since onset.The pain is associated with falling. The pain is present in the lumbar spine. The pain is moderate. The symptoms are aggravated by bending. She has tried nothing for the symptoms.  Wrist Pain  Fall    Past Medical History  Diagnosis Date  . PSVT (paroxysmal supraventricular tachycardia)     normal coronary angiography in 2006; records from EMS documented supraventricular tachycardia at approximately 195 bpm on 05/10/10  . Fibromyalgia   . Degenerative disc disease     LS spine discectomy and fusion; parasternal rod  . Carpal tunnel syndrome   . Hyperlipidemia   . Ulnar neuritis   . Tobacco abuse     40 pack years; one pack per day  . Gastroesophageal reflux disease   . COPD (chronic obstructive pulmonary disease)     Past Surgical History  Procedure Date  . Lumbar disc surgery 07/2010  . Ganglion cyst excision 2009    Right wrist  . Cesarean section 1995  . Tubal ligation 1995  . Carpal tunnel release 2009    Right  . Spinal fixation surgery w/ implant     L4, L5    Family History  Problem Relation Age of Onset  . Arthritis Other   . Stroke Other   . Cancer Mother   . Diabetes Mother   . Hypertension Mother   . Thyroid disease Mother   . Diabetes Brother   . Hypertension Brother     History  Substance Use Topics  . Smoking status: Current  Everyday Smoker -- 1.0 packs/day for 43 years    Types: Cigarettes  . Smokeless tobacco: Never Used  . Alcohol Use: No    OB History    Grav Para Term Preterm Abortions TAB SAB Ect Mult Living   2 2 2       2       Review of Systems  Musculoskeletal: Positive for back pain.  All other systems reviewed and are negative.    Allergies  Sulfonamide derivatives  Home Medications   Current Outpatient Rx  Name Route Sig Dispense Refill  . ALPRAZOLAM 0.25 MG PO TABS Oral Take 0.25 mg by mouth 3 (three) times daily as needed. anxiety    . BC FAST PAIN RELIEF PO Oral Take 1 packet by mouth daily as needed. Migraine     . DILTIAZEM HCL 120 MG PO TABS Oral Take 1 tablet (120 mg total) by mouth daily. 30 tablet 6  . DULOXETINE HCL 60 MG PO CPEP Oral Take 60 mg by mouth daily.     Marland Kitchen HYDROCODONE-ACETAMINOPHEN 10-325 MG PO TABS Oral Take 1 tablet by mouth 4 (four) times daily.     Marland Kitchen METHOCARBAMOL 750 MG PO TABS Oral Take 750 mg by mouth 3 (three) times daily.     Marland Kitchen ONDANSETRON HCL 8 MG  PO TABS Oral Take 8 mg by mouth 3 (three) times daily. For nausea    . PANTOPRAZOLE SODIUM 40 MG PO TBEC Oral Take 40 mg by mouth 2 (two) times daily.     Lenn Sink CF PO Oral Take 10 mLs by mouth 2 (two) times daily. Cough    . BUFFERED C POWDER PO Oral Take 1 packet by mouth daily as needed. Takes for migraines     . CYCLOBENZAPRINE HCL 10 MG PO TABS Oral Take 10 mg by mouth 3 (three) times daily as needed.     Marland Kitchen DEXAMETHASONE 6 MG PO TABS  1 po bid with food 12 tablet 0  . DIAZEPAM 5 MG PO TABS Oral Take 5 mg by mouth every 6 (six) hours as needed.      . OXYCODONE-ACETAMINOPHEN 10-325 MG PO TABS Oral Take 1 tablet by mouth every 4 (four) hours as needed.     Marland Kitchen PROMETHAZINE HCL 25 MG PO TABS Oral Take 25 mg by mouth every 6 (six) hours as needed.     Marland Kitchen PROMETHAZINE-CODEINE 6.25-10 MG/5ML PO SYRP Oral Take by mouth as needed.        BP 116/78  Pulse 83  Temp(Src) 98.1 F (36.7 C) (Oral)  Resp 20  Ht  5\' 7"  (1.702 m)  Wt 179 lb (81.194 kg)  BMI 28.04 kg/m2  SpO2 98%  Physical Exam  Nursing note and vitals reviewed. Constitutional: She is oriented to person, place, and time. Vital signs are normal. She appears well-developed and well-nourished. No distress.  HENT:  Head: Normocephalic and atraumatic.  Right Ear: External ear normal.  Left Ear: External ear normal.  Nose: Nose normal.  Mouth/Throat: No oropharyngeal exudate.  Eyes: Conjunctivae and EOM are normal. Pupils are equal, round, and reactive to light. Right eye exhibits no discharge. Left eye exhibits no discharge. No scleral icterus.  Neck: Normal range of motion. Neck supple. No JVD present. No tracheal deviation present. No thyromegaly present.  Cardiovascular: Normal rate, regular rhythm, normal heart sounds, intact distal pulses and normal pulses.  Exam reveals no gallop and no friction rub.   No murmur heard. Pulmonary/Chest: Effort normal and breath sounds normal. No stridor. No respiratory distress. She has no wheezes. She has no rales. She exhibits no tenderness.  Abdominal: Soft. Normal appearance and bowel sounds are normal. She exhibits no distension and no mass. There is no tenderness. There is no rebound and no guarding.  Musculoskeletal: She exhibits tenderness. She exhibits no edema.       Right wrist: She exhibits decreased range of motion, tenderness and bony tenderness. She exhibits no swelling, no effusion, no crepitus, no deformity and no laceration.       Lumbar back: She exhibits decreased range of motion, tenderness, bony tenderness and pain. She exhibits no swelling, no deformity, no laceration, no spasm and normal pulse.       Arms: Lymphadenopathy:    She has no cervical adenopathy.  Neurological: She is alert and oriented to person, place, and time. She has normal reflexes. Coordination normal. GCS eye subscore is 4. GCS verbal subscore is 5. GCS motor subscore is 6.  Skin: Skin is warm and dry. No  rash noted. She is not diaphoretic.  Psychiatric: She has a normal mood and affect. Her speech is normal and behavior is normal. Judgment and thought content normal. Cognition and memory are normal.    ED Course  Procedures (including critical care time)  Labs Reviewed -  No data to display Dg Lumbar Spine Complete  03/17/2011  *RADIOLOGY REPORT*  Clinical Data: Fall with low back pain.  LUMBAR SPINE - COMPLETE 4+ VIEW  Comparison: 02/05/2011.  Findings: There is levoconvex curvature of the lumbar spine, stable. L5-S1 posterior lumbar interbody fusion.  Alignment is otherwise anatomic.  Vertebral body and disc space height are maintained.  Mild endplate degenerative changes in the mid and lower lumbar spine.  No definite pars defects.  IMPRESSION:  1.  L5-S1 PLIF without complicating feature. 2.  Mild spondylosis.  Original Report Authenticated By: Reyes Ivan, M.D.   Dg Wrist Complete Right  03/17/2011  *RADIOLOGY REPORT*  Clinical Data: Larey Seat with pain  RIGHT WRIST - COMPLETE 3+ VIEW  Comparison: None.  Findings: The radiocarpal joint space appears normal.  The ulnar styloid is intact.  The carpal bones are in normal position.  No acute bony abnormality is seen.  IMPRESSION: No acute abnormality.  Original Report Authenticated By: Juline Patch, M.D.     No diagnosis found.    MDM          Worthy Rancher, PA 03/17/11 [phone]

## 2011-03-17 NOTE — ED Notes (Signed)
C/o lower back pain and right wrist pain s/p fall 3 days ago; states tripped and fell landing on cement floor.

## 2011-03-18 NOTE — ED Provider Notes (Signed)
Medical screening examination/treatment/procedure(s) were performed by non-physician practitioner and as supervising physician I was immediately available for consultation/collaboration. Devoria Albe, MD, Armando Gang   Ward Givens, MD 03/18/11 [phone]

## 2011-09-01 ENCOUNTER — Other Ambulatory Visit: Payer: Self-pay | Admitting: Neurosurgery

## 2011-09-01 DIAGNOSIS — M542 Cervicalgia: Secondary | ICD-10-CM

## 2011-09-09 ENCOUNTER — Ambulatory Visit
Admission: RE | Admit: 2011-09-09 | Discharge: 2011-09-09 | Disposition: A | Discharge status: Home / Self Care | Payer: Medicare HMO | Source: Ambulatory Visit | Attending: Neurosurgery | Admitting: Neurosurgery

## 2011-09-09 DIAGNOSIS — M542 Cervicalgia: Secondary | ICD-10-CM

## 2011-09-09 MED ORDER — GADOBENATE DIMEGLUMINE 529 MG/ML IV SOLN
14.0000 mL | Freq: Once | INTRAVENOUS | Status: AC | PRN
  Administered 2011-09-09: 14 mL via INTRAVENOUS

## 2011-10-06 ENCOUNTER — Encounter (HOSPITAL_COMMUNITY): Payer: Self-pay

## 2011-10-06 ENCOUNTER — Emergency Department (HOSPITAL_COMMUNITY): Payer: No Typology Code available for payment source

## 2011-10-06 ENCOUNTER — Emergency Department (HOSPITAL_COMMUNITY)
Admission: EM | Admit: 2011-10-06 | Discharge: 2011-10-06 | Disposition: A | Discharge status: Home / Self Care | Payer: No Typology Code available for payment source | Attending: Emergency Medicine | Admitting: Emergency Medicine

## 2011-10-06 DIAGNOSIS — M545 Low back pain, unspecified: Secondary | ICD-10-CM | POA: Insufficient documentation

## 2011-10-06 DIAGNOSIS — M546 Pain in thoracic spine: Secondary | ICD-10-CM | POA: Insufficient documentation

## 2011-10-06 DIAGNOSIS — J4489 Other specified chronic obstructive pulmonary disease: Secondary | ICD-10-CM | POA: Insufficient documentation

## 2011-10-06 DIAGNOSIS — E785 Hyperlipidemia, unspecified: Secondary | ICD-10-CM | POA: Insufficient documentation

## 2011-10-06 DIAGNOSIS — S39012A Strain of muscle, fascia and tendon of lower back, initial encounter: Secondary | ICD-10-CM

## 2011-10-06 DIAGNOSIS — F17200 Nicotine dependence, unspecified, uncomplicated: Secondary | ICD-10-CM | POA: Insufficient documentation

## 2011-10-06 DIAGNOSIS — Z79899 Other long term (current) drug therapy: Secondary | ICD-10-CM | POA: Insufficient documentation

## 2011-10-06 DIAGNOSIS — S239XXA Sprain of unspecified parts of thorax, initial encounter: Secondary | ICD-10-CM | POA: Insufficient documentation

## 2011-10-06 DIAGNOSIS — M25569 Pain in unspecified knee: Secondary | ICD-10-CM | POA: Insufficient documentation

## 2011-10-06 DIAGNOSIS — G8929 Other chronic pain: Secondary | ICD-10-CM | POA: Insufficient documentation

## 2011-10-06 DIAGNOSIS — IMO0002 Reserved for concepts with insufficient information to code with codable children: Secondary | ICD-10-CM

## 2011-10-06 DIAGNOSIS — S335XXA Sprain of ligaments of lumbar spine, initial encounter: Secondary | ICD-10-CM | POA: Insufficient documentation

## 2011-10-06 DIAGNOSIS — Z9889 Other specified postprocedural states: Secondary | ICD-10-CM | POA: Insufficient documentation

## 2011-10-06 DIAGNOSIS — K219 Gastro-esophageal reflux disease without esophagitis: Secondary | ICD-10-CM | POA: Insufficient documentation

## 2011-10-06 DIAGNOSIS — M25519 Pain in unspecified shoulder: Secondary | ICD-10-CM | POA: Insufficient documentation

## 2011-10-06 DIAGNOSIS — IMO0001 Reserved for inherently not codable concepts without codable children: Secondary | ICD-10-CM | POA: Insufficient documentation

## 2011-10-06 DIAGNOSIS — S8000XA Contusion of unspecified knee, initial encounter: Secondary | ICD-10-CM

## 2011-10-06 DIAGNOSIS — J449 Chronic obstructive pulmonary disease, unspecified: Secondary | ICD-10-CM | POA: Insufficient documentation

## 2011-10-06 HISTORY — DX: Polyneuropathy, unspecified: G62.9

## 2011-10-06 MED ORDER — OXYCODONE-ACETAMINOPHEN 5-325 MG PO TABS
1.0000 | ORAL_TABLET | Freq: Once | ORAL | Status: AC
  Administered 2011-10-06: 1 via ORAL
  Filled 2011-10-06: qty 1

## 2011-10-06 MED ORDER — KETOROLAC TROMETHAMINE 60 MG/2ML IM SOLN
60.0000 mg | Freq: Once | INTRAMUSCULAR | Status: AC
  Administered 2011-10-06: 60 mg via INTRAMUSCULAR
  Filled 2011-10-06: qty 2

## 2011-10-06 MED ORDER — OXYCODONE-ACETAMINOPHEN 5-325 MG PO TABS: 1.0000 | ORAL_TABLET | ORAL | Status: AC | PRN
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
## 2011-10-06 NOTE — Discharge Instructions (Signed)
Contusion A contusion is a deep bruise. Contusions are the result of an injury that caused bleeding under the skin. The contusion may turn blue, purple, or yellow. Minor injuries will give you a painless contusion, but more severe contusions may stay painful and swollen for a few weeks.  CAUSES  A contusion is usually caused by a blow, trauma, or direct force to an area of the body. SYMPTOMS   Swelling and redness of the injured area.   Bruising of the injured area.   Tenderness and soreness of the injured area.   Pain.  DIAGNOSIS  The diagnosis can be made by taking a history and physical exam. An X-ray, CT scan, or MRI may be needed to determine if there were any associated injuries, such as fractures. TREATMENT  Specific treatment will depend on what area of the body was injured. In general, the best treatment for a contusion is resting, icing, elevating, and applying cold compresses to the injured area. Over-the-counter medicines may also be recommended for pain control. Ask your caregiver what the best treatment is for your contusion. HOME CARE INSTRUCTIONS   Put ice on the injured area.   Put ice in a plastic bag.   Place a towel between your skin and the bag.   Leave the ice on for 15 to 20 minutes, 3 to 4 times a day.   Only take over-the-counter or prescription medicines for pain, discomfort, or fever as directed by your caregiver. Your caregiver may recommend avoiding anti-inflammatory medicines (aspirin, ibuprofen, and naproxen) for 48 hours because these medicines may increase bruising.   Rest the injured area.   If possible, elevate the injured area to reduce swelling.  SEEK IMMEDIATE MEDICAL CARE IF:   You have increased bruising or swelling.   You have pain that is getting worse.   Your swelling or pain is not relieved with medicines.  MAKE SURE YOU:   Understand these instructions.   Will watch your condition.   Will get help right away if you are not  doing well or get worse.  Document Released: 03/03/2005 Document Revised: 05/13/2011 Document Reviewed: 03/29/2011 [facility] Patient Information 2012 [place], [place].Back Pain, Adult Low back pain is very common. About 1 in 5 people have back pain.The cause of low back pain is rarely dangerous. The pain often gets better over time.About half of people with a sudden onset of back pain feel better in just 2 weeks. About 8 in 10 people feel better by 6 weeks.  CAUSES Some common causes of back pain include:  Strain of the muscles or ligaments supporting the spine.   Wear and tear (degeneration) of the spinal discs.   Arthritis.   Direct injury to the back.  DIAGNOSIS Most of the time, the direct cause of low back pain is not known.However, back pain can be treated effectively even when the exact cause of the pain is unknown.Answering your caregiver's questions about your overall health and symptoms is one of the most accurate ways to make sure the cause of your pain is not dangerous. If your caregiver needs more information, he or she may order lab work or imaging tests (X-rays or MRIs).However, even if imaging tests show changes in your back, this usually does not require surgery. HOME CARE INSTRUCTIONS For many people, back pain returns.Since low back pain is rarely dangerous, it is often a condition that people can learn to [facility] their own.   Remain active. It is stressful on the back to  sit or stand in one place. Do not sit, drive, or stand in one place for more than 30 minutes at a time. Take short walks on level surfaces as soon as pain allows.Try to increase the length of time you walk each day.   Do not stay in bed.Resting more than 1 or 2 days can delay your recovery.   Do not avoid exercise or work.Your body is made to move.It is not dangerous to be active, even though your back may hurt.Your back will likely heal faster if you return to being active before your pain is  gone.   Pay attention to your body when you bend and lift. Many people have less discomfortwhen lifting if they bend their knees, keep the load close to their bodies,and avoid twisting. Often, the most comfortable positions are those that put less stress on your recovering back.   Find a comfortable position to sleep. Use a firm mattress and lie on your side with your knees slightly bent. If you lie on your back, put a pillow under your knees.   Only take over-the-counter or prescription medicines as directed by your caregiver. Over-the-counter medicines to reduce pain and inflammation are often the most helpful.Your caregiver may prescribe muscle relaxant drugs.These medicines help dull your pain so you can more quickly return to your normal activities and healthy exercise.   Put ice on the injured area.   Put ice in a plastic bag.   Place a towel between your skin and the bag.   Leave the ice on for 15 to 20 minutes, 3 to 4 times a day for the first 2 to 3 days. After that, ice and heat may be alternated to reduce pain and spasms.   Ask your caregiver about trying back exercises and gentle massage. This may be of some benefit.   Avoid feeling anxious or stressed.Stress increases muscle tension and can worsen back pain.It is important to recognize when you are anxious or stressed and learn ways to manage it.Exercise is a great option.  SEEK MEDICAL CARE IF:  You have pain that is not relieved with rest or medicine.   You have pain that does not improve in 1 week.   You have new symptoms.   You are generally not feeling well.  SEEK IMMEDIATE MEDICAL CARE IF:   You have pain that radiates from your back into your legs.   You develop new bowel or bladder control problems.   You have unusual weakness or numbness in your arms or legs.   You develop nausea or vomiting.   You develop abdominal pain.   You feel faint.  Document Released: 05/24/2005 Document Revised:  05/13/2011 Document Reviewed: 10/12/2010 [facility] Patient Information 2012 [place], [place].   You may use the medicines prescribed in place of your hydrocodone for the next several days for improved pain relief.  Expect that your symptoms should gradually improve over the next week.  However please call Dr. Lovell Sheehan for recheck if you continue to have increased pain.  Your x-rays today show no sign of any bony injury or disruption of the hardware in your lower back.  Do not drive within 4 hours of taking oxycodone as this medication will make you drowsy.
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
## 2011-10-06 NOTE — ED Provider Notes (Signed)
Medical screening examination/treatment/procedure(s) were performed by non-physician practitioner and as supervising physician I was immediately available for consultation/collaboration.   Carleene Cooper III, MD 10/06/11 Rosamaria Lints

## 2011-11-02 ENCOUNTER — Encounter (HOSPITAL_COMMUNITY): Payer: Self-pay

## 2011-11-02 ENCOUNTER — Emergency Department (HOSPITAL_COMMUNITY)
Admission: EM | Admit: 2011-11-02 | Discharge: 2011-11-02 | Disposition: A | Discharge status: Home / Self Care | Payer: Medicare Other | Attending: Emergency Medicine | Admitting: Emergency Medicine

## 2011-11-02 DIAGNOSIS — E785 Hyperlipidemia, unspecified: Secondary | ICD-10-CM | POA: Insufficient documentation

## 2011-11-02 DIAGNOSIS — F17200 Nicotine dependence, unspecified, uncomplicated: Secondary | ICD-10-CM | POA: Insufficient documentation

## 2011-11-02 DIAGNOSIS — J4489 Other specified chronic obstructive pulmonary disease: Secondary | ICD-10-CM | POA: Insufficient documentation

## 2011-11-02 DIAGNOSIS — J029 Acute pharyngitis, unspecified: Secondary | ICD-10-CM

## 2011-11-02 DIAGNOSIS — R509 Fever, unspecified: Secondary | ICD-10-CM | POA: Insufficient documentation

## 2011-11-02 DIAGNOSIS — IMO0001 Reserved for inherently not codable concepts without codable children: Secondary | ICD-10-CM | POA: Insufficient documentation

## 2011-11-02 DIAGNOSIS — J449 Chronic obstructive pulmonary disease, unspecified: Secondary | ICD-10-CM | POA: Insufficient documentation

## 2011-11-02 DIAGNOSIS — M51379 Other intervertebral disc degeneration, lumbosacral region without mention of lumbar back pain or lower extremity pain: Secondary | ICD-10-CM | POA: Insufficient documentation

## 2011-11-02 DIAGNOSIS — M5137 Other intervertebral disc degeneration, lumbosacral region: Secondary | ICD-10-CM | POA: Insufficient documentation

## 2011-11-02 DIAGNOSIS — K219 Gastro-esophageal reflux disease without esophagitis: Secondary | ICD-10-CM | POA: Insufficient documentation

## 2011-11-02 DIAGNOSIS — Z79899 Other long term (current) drug therapy: Secondary | ICD-10-CM | POA: Insufficient documentation

## 2011-11-02 LAB — RAPID STREP SCREEN (MED CTR MEBANE ONLY): Streptococcus, Group A Screen (Direct): NEGATIVE

## 2011-11-02 MED ORDER — IBUPROFEN 100 MG/5ML PO SUSP
400.0000 mg | Freq: Once | ORAL | Status: AC
  Administered 2011-11-02: 400 mg via ORAL
  Filled 2011-11-02: qty 20

## 2011-11-02 MED ORDER — ONDANSETRON HCL 4 MG PO TABS: 4.0000 mg | ORAL_TABLET | Freq: Four times a day (QID) | ORAL | Status: AC

## 2011-11-02 MED ORDER — ONDANSETRON 4 MG PO TBDP
4.0000 mg | ORAL_TABLET | Freq: Once | ORAL | Status: AC
  Administered 2011-11-02: 4 mg via ORAL
  Filled 2011-11-02: qty 1

## 2011-11-02 MED ORDER — PENICILLIN G BENZATHINE 1200000 UNIT/2ML IM SUSP
1.2000 10*6.[IU] | Freq: Once | INTRAMUSCULAR | Status: AC
  Administered 2011-11-02: 1.2 10*6.[IU] via INTRAMUSCULAR
  Filled 2011-11-02: qty 2

## 2011-11-02 NOTE — Discharge Instructions (Signed)

## 2011-11-02 NOTE — ED Notes (Signed)
C/o sore throat with fever that began last pm

## 2011-11-02 NOTE — ED Notes (Signed)
Waiting shot time.

## 2011-11-02 NOTE — ED Provider Notes (Signed)
History     CSN: [id number]  Arrival date & time 11/02/11  1038   None     Chief Complaint  Patient presents with  . Sore Throat  . Fever    (Consider location/radiation/quality/duration/timing/severity/associated sxs/prior treatment) Patient is a 51 y.o. female presenting with pharyngitis and fever. The history is provided by the patient.  Sore Throat This is a new problem. The problem occurs constantly. The problem has been gradually worsening. Associated symptoms include chills, a fever and nausea. Pertinent negatives include no abdominal pain, arthralgias, chest pain, coughing or neck pain. The symptoms are aggravated by swallowing. She has tried nothing for the symptoms. The treatment provided no relief.  Fever Primary symptoms of the febrile illness include fever and nausea. Primary symptoms do not include cough, wheezing, shortness of breath, abdominal pain, dysuria or arthralgias.    Past Medical History  Diagnosis Date  . PSVT (paroxysmal supraventricular tachycardia)     normal coronary angiography in 2006; records from EMS documented supraventricular tachycardia at approximately 195 bpm on 05/10/10  . Fibromyalgia   . Degenerative disc disease     LS spine discectomy and fusion; parasternal rod  . Carpal tunnel syndrome   . Hyperlipidemia   . Ulnar neuritis   . Tobacco abuse     40 pack years; one pack per day  . Gastroesophageal reflux disease   . COPD (chronic obstructive pulmonary disease)   . Neuropathy     Past Surgical History  Procedure Date  . Lumbar disc surgery 07/2010  . Ganglion cyst excision 2009    Right wrist  . Cesarean section 1995  . Tubal ligation 1995  . Carpal tunnel release 2009    Right  . Spinal fixation surgery w/ implant     L4, L5    Family History  Problem Relation Age of Onset  . Arthritis Other   . Stroke Other   . Cancer Mother   . Diabetes Mother   . Hypertension Mother   . Thyroid disease Mother   . Diabetes  Brother   . Hypertension Brother     History  Substance Use Topics  . Smoking status: Current Everyday Smoker -- 1.0 packs/day for 43 years    Types: Cigarettes  . Smokeless tobacco: Never Used  . Alcohol Use: No    OB History    Grav Para Term Preterm Abortions TAB SAB Ect Mult Living   2 2 2       2       Review of Systems  Constitutional: Positive for fever and chills. Negative for activity change.       All ROS Neg except as noted in HPI  HENT: Negative for nosebleeds and neck pain.   Eyes: Negative for photophobia and discharge.  Respiratory: Negative for cough, shortness of breath and wheezing.   Cardiovascular: Negative for chest pain and palpitations.  Gastrointestinal: Positive for nausea. Negative for abdominal pain and blood in stool.  Genitourinary: Negative for dysuria, frequency and hematuria.  Musculoskeletal: Negative for back pain and arthralgias.  Skin: Negative.   Neurological: Negative for dizziness, seizures and speech difficulty.  Psychiatric/Behavioral: Negative for hallucinations and confusion.    Allergies  Sulfonamide derivatives  Home Medications   Current Outpatient Rx  Name Route Sig Dispense Refill  . ALPRAZOLAM 0.25 MG PO TABS Oral Take 0.25 mg by mouth 3 (three) times daily as needed. anxiety    . BUFFERED C POWDER PO Oral Take 1 packet by mouth daily  as needed. Takes for migraines     . BC FAST PAIN RELIEF PO Oral Take 1 packet by mouth daily as needed. Migraine     . CYCLOBENZAPRINE HCL 10 MG PO TABS Oral Take 10 mg by mouth 3 (three) times daily as needed.     Marland Kitchen DEXAMETHASONE 6 MG PO TABS  1 po bid with food 12 tablet 0  . DIAZEPAM 5 MG PO TABS Oral Take 5 mg by mouth every 6 (six) hours as needed.      Marland Kitchen DILTIAZEM HCL 120 MG PO TABS Oral Take 1 tablet (120 mg total) by mouth daily. 30 tablet 6  . DULOXETINE HCL 60 MG PO CPEP Oral Take 60 mg by mouth daily.     Marland Kitchen HYDROCODONE-ACETAMINOPHEN 10-325 MG PO TABS Oral Take 1 tablet by mouth 4  (four) times daily.     Marland Kitchen METHOCARBAMOL 750 MG PO TABS Oral Take 750 mg by mouth 3 (three) times daily.     Marland Kitchen ONDANSETRON HCL 8 MG PO TABS Oral Take 8 mg by mouth 3 (three) times daily. For nausea    . OXYCODONE-ACETAMINOPHEN 10-325 MG PO TABS Oral Take 1 tablet by mouth every 4 (four) hours as needed.     Marland Kitchen PANTOPRAZOLE SODIUM 40 MG PO TBEC Oral Take 40 mg by mouth 2 (two) times daily.     Marland Kitchen PROMETHAZINE HCL 25 MG PO TABS Oral Take 25 mg by mouth every 6 (six) hours as needed.     Marland Kitchen PROMETHAZINE-CODEINE 6.25-10 MG/5ML PO SYRP Oral Take by mouth as needed.      Lenn Sink CF PO Oral Take 10 mLs by mouth 2 (two) times daily. Cough      BP 115/75  Pulse 90  Temp 97.8 F (36.6 C)  Resp 18  Ht 5\' 7"  (1.702 m)  Wt 172 lb (78.019 kg)  BMI 26.94 kg/m2  SpO2 96%  Physical Exam  Nursing note and vitals reviewed. Constitutional: She is oriented to person, place, and time. She appears well-developed and well-nourished.  Non-toxic appearance.  HENT:  Head: Normocephalic.  Right Ear: Tympanic membrane and external ear normal.  Left Ear: Tympanic membrane and external ear normal.       Increase redness of the posterior pharynx. Airway clear. Uvula swollen, but midline.  Eyes: EOM and lids are normal. Pupils are equal, round, and reactive to light.  Neck: Normal range of motion. Neck supple. Carotid bruit is not present.  Cardiovascular: Normal rate, regular rhythm, normal heart sounds, intact distal pulses and normal pulses.   Pulmonary/Chest: Breath sounds normal. No respiratory distress.       Few scattered rhonchi.  Abdominal: Soft. Bowel sounds are normal. There is no tenderness. There is no guarding.  Musculoskeletal: Normal range of motion.  Lymphadenopathy:       Head (right side): No submandibular adenopathy present.       Head (left side): No submandibular adenopathy present.    She has no cervical adenopathy.  Neurological: She is alert and oriented to person, place, and time.  She has normal strength. No cranial nerve deficit or sensory deficit.  Skin: Skin is warm and dry.  Psychiatric: She has a normal mood and affect. Her speech is normal.    ED Course  Procedures (including critical care time)   Labs Reviewed  RAPID STREP SCREEN   No results found.   No diagnosis found.    MDM  I have reviewed nursing notes, vital signs, and  all appropriate lab and imaging results for this patient. Strep screen neg .  Pt advised to use tylenol every 4 hours. Increase fluids. And to return if not improving.       Kathie Dike, [place] 11/17/11 1657

## 2011-11-07 ENCOUNTER — Emergency Department (HOSPITAL_COMMUNITY)
Admission: EM | Admit: 2011-11-07 | Discharge: 2011-11-07 | Disposition: A | Discharge status: Home / Self Care | Payer: Medicare Other | Attending: Emergency Medicine | Admitting: Emergency Medicine

## 2011-11-07 ENCOUNTER — Encounter (HOSPITAL_COMMUNITY): Payer: Self-pay | Admitting: *Deleted

## 2011-11-07 ENCOUNTER — Emergency Department (HOSPITAL_COMMUNITY): Payer: Medicare Other

## 2011-11-07 DIAGNOSIS — S92309A Fracture of unspecified metatarsal bone(s), unspecified foot, initial encounter for closed fracture: Secondary | ICD-10-CM | POA: Insufficient documentation

## 2011-11-07 DIAGNOSIS — M51379 Other intervertebral disc degeneration, lumbosacral region without mention of lumbar back pain or lower extremity pain: Secondary | ICD-10-CM | POA: Insufficient documentation

## 2011-11-07 DIAGNOSIS — Y998 Other external cause status: Secondary | ICD-10-CM | POA: Insufficient documentation

## 2011-11-07 DIAGNOSIS — X500XXA Overexertion from strenuous movement or load, initial encounter: Secondary | ICD-10-CM | POA: Insufficient documentation

## 2011-11-07 DIAGNOSIS — F17200 Nicotine dependence, unspecified, uncomplicated: Secondary | ICD-10-CM | POA: Insufficient documentation

## 2011-11-07 DIAGNOSIS — M5137 Other intervertebral disc degeneration, lumbosacral region: Secondary | ICD-10-CM | POA: Insufficient documentation

## 2011-11-07 DIAGNOSIS — Z79899 Other long term (current) drug therapy: Secondary | ICD-10-CM | POA: Insufficient documentation

## 2011-11-07 DIAGNOSIS — Y92009 Unspecified place in unspecified non-institutional (private) residence as the place of occurrence of the external cause: Secondary | ICD-10-CM | POA: Insufficient documentation

## 2011-11-07 DIAGNOSIS — J449 Chronic obstructive pulmonary disease, unspecified: Secondary | ICD-10-CM | POA: Insufficient documentation

## 2011-11-07 DIAGNOSIS — M79609 Pain in unspecified limb: Secondary | ICD-10-CM | POA: Insufficient documentation

## 2011-11-07 DIAGNOSIS — E785 Hyperlipidemia, unspecified: Secondary | ICD-10-CM | POA: Insufficient documentation

## 2011-11-07 DIAGNOSIS — J4489 Other specified chronic obstructive pulmonary disease: Secondary | ICD-10-CM | POA: Insufficient documentation

## 2011-11-07 DIAGNOSIS — K219 Gastro-esophageal reflux disease without esophagitis: Secondary | ICD-10-CM | POA: Insufficient documentation

## 2011-11-07 MED ORDER — OXYCODONE-ACETAMINOPHEN 5-325 MG PO TABS: 1.0000 | ORAL_TABLET | ORAL | Status: AC | PRN

## 2011-11-07 MED ORDER — OXYCODONE-ACETAMINOPHEN 5-325 MG PO TABS
1.0000 | ORAL_TABLET | Freq: Once | ORAL | Status: AC
  Administered 2011-11-07: 1 via ORAL
  Filled 2011-11-07: qty 1

## 2011-11-07 NOTE — Discharge Instructions (Signed)
Foot Fracture Your caregiver has diagnosed you as having a foot fracture (broken bone). Your foot has many bones. You have a fracture, or break, in one of these bones. In some cases, your doctor may put on a splint or removable fracture boot until the swelling in your foot has lessened. A cast may or may not be required. HOME CARE INSTRUCTIONS  If you do not have a cast or splint:  You may bear weight on your injured foot as tolerated or advised.   Do not put any weight on your injured foot for as long as directed by your caregiver. Slowly increase the amount of time you walk on the foot as the pain and swelling allows or as advised.   Use crutches until you can bear weight without pain. A gradual increase in weight bearing may help.   Apply ice to the injury for 15 to 20 minutes each hour while awake for the first 2 days. Put the ice in a plastic bag and place a towel between the bag of ice and your skin.   If an ace bandage (stretchy, elastic wrapping bandage) was applied, you may re-wrap it if ankle is more painful or your toes become cold and swollen.  If you have a cast or splint:  Use your crutches for as long as directed by your caregiver.   To lessen the swelling, keep the injured foot elevated on pillows while lying down or sitting. Elevate your foot above your heart.   Apply ice to the injury for 15 to 20 minutes each hour while awake for the first 2 days. Put the ice in a plastic bag and place a thin towel between the bag of ice and your cast.   Plaster or fiberglass cast:   Do not try to scratch the skin under the cast using a sharp or pointed object down the cast.   Check the skin around the cast every day. You may put lotion on any red or sore areas.   Keep your cast clean and dry.   Plaster splint:   Wear the splint until you are seen for a follow-up examination.   You may loosen the elastic around the splint if your toes become numb, tingle, or turn blue or cold. Do  not rest it on anything harder than a pillow in the first 24 hours.   Do not put pressure on any part of your splint. Use your crutches as directed.   Keep your splint dry. It can be protected during bathing with a plastic bag. Do not lower the splint into water.   If you have a fracture boot you may remove it to shower. Bear weight only as instructed by your caregiver.   Only take over-the-counter or prescription medicines for pain, discomfort, or fever as directed by your caregiver.  SEEK IMMEDIATE MEDICAL CARE IF:   Your cast gets damaged or breaks.   You have continued severe pain or more swelling than you did before the cast was put on.   Your skin or nails of your casted foot turn blue, gray, feel cold or numb.   There is a bad smell from your cast.   There is severe pain with movement of your toes.   There are new stains and/or drainage coming from under the cast.  MAKE SURE YOU:   Understand these instructions.   Will watch your condition.   Will get help right away if you are not doing well or get   worse.  Document Released: 05/21/2000 Document Revised: 05/13/2011 Document Reviewed: 06/27/2008 [facility] Patient Information 2012 [place], [place].   Use the medication prescribed if needed for pain.  Ice and elevate your foot for the next 2 days as discussed.  You should crutches to minimize weightbearing and wear the Cam Walker to protect her foot at all times while awake.  Call Dr. Romeo Apple for recheck of your injury this week.

## 2011-11-07 NOTE — ED Notes (Signed)
Pt twisting right foot this am, pain to right foot and ankle area, cms intact distal

## 2011-11-07 NOTE — ED Notes (Signed)
Pt presents with rt foot pain after an injury early this morning. Pt states got out of bed ,"foot was asleep and double back under while attempting to answer the phone". Swelling noted with a slight deformity to lateral rt ankle. Pt is alert, oriented x 4. Skin warm and dry to touch. NAD noted

## 2011-11-09 ENCOUNTER — Ambulatory Visit (INDEPENDENT_AMBULATORY_CARE_PROVIDER_SITE_OTHER): Payer: Medicare Other | Admitting: Orthopedic Surgery

## 2011-11-09 ENCOUNTER — Encounter: Payer: Self-pay | Admitting: Orthopedic Surgery

## 2011-11-09 VITALS — BP 100/58 | Ht 67.0 in | Wt 178.0 lb

## 2011-11-09 DIAGNOSIS — S93409A Sprain of unspecified ligament of unspecified ankle, initial encounter: Secondary | ICD-10-CM

## 2011-11-09 DIAGNOSIS — S92309A Fracture of unspecified metatarsal bone(s), unspecified foot, initial encounter for closed fracture: Secondary | ICD-10-CM

## 2011-11-09 NOTE — ED Provider Notes (Signed)
History     CSN: [id number]  Arrival date & time 11/07/11  1336   First MD Initiated Contact with Patient 11/07/11 1351      Chief Complaint  Patient presents with  . Foot Injury    (Consider location/radiation/quality/duration/timing/severity/associated sxs/prior treatment) HPI Comments: Susan Potts presents with right ankle and foot pain when she got out of bed this am to answer the phone,  Her foot was "asleep" causing her to invert the ankle.  Pain is constant and throbbing along her left lateral ankle and also across her lateral midfoot.  She has swelling  Which has been improved somewhat using ice.  She denies any other injury.  Patient is a 51 y.o. female presenting with foot injury. The history is provided by the patient.  Foot Injury  Pertinent negatives include no numbness.    Past Medical History  Diagnosis Date  . PSVT (paroxysmal supraventricular tachycardia)     normal coronary angiography in 2006; records from EMS documented supraventricular tachycardia at approximately 195 bpm on 05/10/10  . Fibromyalgia   . Degenerative disc disease     LS spine discectomy and fusion; parasternal rod  . Carpal tunnel syndrome   . Hyperlipidemia   . Ulnar neuritis   . Tobacco abuse     40 pack years; one pack per day  . Gastroesophageal reflux disease   . COPD (chronic obstructive pulmonary disease)   . Neuropathy     Past Surgical History  Procedure Date  . Lumbar disc surgery 07/2010  . Ganglion cyst excision 2009    Right wrist  . Cesarean section 1995  . Tubal ligation 1995  . Carpal tunnel release 2009    Right  . Spinal fixation surgery w/ implant     L4, L5    Family History  Problem Relation Age of Onset  . Arthritis Other   . Stroke Other   . Cancer Mother   . Diabetes Mother   . Hypertension Mother   . Thyroid disease Mother   . Diabetes Brother   . Hypertension Brother     History  Substance Use Topics  . Smoking status: Current Everyday  Smoker -- 1.0 packs/day for 43 years    Types: Cigarettes  . Smokeless tobacco: Never Used  . Alcohol Use: No    OB History    Grav Para Term Preterm Abortions TAB SAB Ect Mult Living   2 2 2       2       Review of Systems  Musculoskeletal: Positive for joint swelling and arthralgias.  Skin: Negative for wound.  Neurological: Negative for weakness and numbness.    Allergies  Sulfonamide derivatives  Home Medications   Current Outpatient Rx  Name Route Sig Dispense Refill  . ALPRAZOLAM 0.25 MG PO TABS Oral Take 0.25 mg by mouth 3 (three) times daily as needed. anxiety    . BUFFERED C POWDER PO Oral Take 1 packet by mouth daily as needed. Takes for migraines     . BC FAST PAIN RELIEF PO Oral Take 1 packet by mouth daily as needed. Migraine     . CYCLOBENZAPRINE HCL 10 MG PO TABS Oral Take 10 mg by mouth 3 (three) times daily as needed.     Marland Kitchen DEXAMETHASONE 6 MG PO TABS  1 po bid with food 12 tablet 0  . DIAZEPAM 5 MG PO TABS Oral Take 5 mg by mouth every 6 (six) hours as needed.      Marland Kitchen  DILTIAZEM HCL 120 MG PO TABS Oral Take 1 tablet (120 mg total) by mouth daily. 30 tablet 6  . DULOXETINE HCL 60 MG PO CPEP Oral Take 60 mg by mouth daily.     Marland Kitchen HYDROCODONE-ACETAMINOPHEN 10-325 MG PO TABS Oral Take 1 tablet by mouth 4 (four) times daily.     Marland Kitchen METHOCARBAMOL 750 MG PO TABS Oral Take 750 mg by mouth 3 (three) times daily.     Marland Kitchen ONDANSETRON HCL 4 MG PO TABS Oral Take 1 tablet (4 mg total) by mouth every 6 (six) hours. 8 tablet 0  . ONDANSETRON HCL 8 MG PO TABS Oral Take 8 mg by mouth 3 (three) times daily. For nausea    . OXYCODONE-ACETAMINOPHEN 10-325 MG PO TABS Oral Take 1 tablet by mouth every 4 (four) hours as needed.     . OXYCODONE-ACETAMINOPHEN 5-325 MG PO TABS Oral Take 1 tablet by mouth every 4 (four) hours as needed for pain. 20 tablet 0  . PANTOPRAZOLE SODIUM 40 MG PO TBEC Oral Take 40 mg by mouth 2 (two) times daily.     Marland Kitchen PROMETHAZINE HCL 25 MG PO TABS Oral Take 25 mg by  mouth every 6 (six) hours as needed.     Marland Kitchen PROMETHAZINE-CODEINE 6.25-10 MG/5ML PO SYRP Oral Take by mouth as needed.      Lenn Sink CF PO Oral Take 10 mLs by mouth 2 (two) times daily. Cough      BP 111/72  Pulse 77  Temp 98 F (36.7 C)  Resp 18  Ht 5\' 7"  (1.702 m)  Wt 178 lb (80.74 kg)  BMI 27.88 kg/m2  SpO2 98%  Physical Exam  Nursing note and vitals reviewed. Constitutional: She appears well-developed and well-nourished.  HENT:  Head: Normocephalic.  Cardiovascular: Normal rate and intact distal pulses.  Exam reveals no decreased pulses.   Pulses:      Dorsalis pedis pulses are 2+ on the right side, and 2+ on the left side.       Posterior tibial pulses are 2+ on the right side, and 2+ on the left side.  Musculoskeletal: She exhibits edema and tenderness.       Right ankle: She exhibits decreased range of motion, swelling and ecchymosis. She exhibits normal pulse. tenderness. Lateral malleolus tenderness found. No head of 5th metatarsal and no proximal fibula tenderness found. Achilles tendon normal.       Left ankle: She exhibits swelling. She exhibits normal pulse. tenderness. Lateral malleolus tenderness found. No proximal fibula tenderness found. Achilles tendon normal.       Left foot: She exhibits tenderness.       Feet:  Neurological: She is alert. No sensory deficit.  Skin: Skin is warm, dry and intact.    ED Course  Procedures (including critical care time)  Labs Reviewed - No data to display Dg Ankle Complete Right  11/07/2011  *RADIOLOGY REPORT*  Clinical Data: Twisting injury, lateral pain and swelling  RIGHT ANKLE - COMPLETE 3+ VIEW  Comparison: 11/07/2011  Findings: Intact malleoli, talus and calcaneus.  No acute ankle fracture or malalignment.  Preserved joint spaces.  Small nondisplaced fracture suspected at the right fifth metatarsal base.  IMPRESSION: Small nondisplaced fracture right fifth metatarsal base.  No acute osseous finding of the ankle.   Original Report Authenticated By: Judie Petit. Ruel Favors, M.D.   Dg Foot Complete Right  11/07/2011  *RADIOLOGY REPORT*  Clinical Data: Lateral ankle and foot pain, swelling  RIGHT FOOT COMPLETE -  3+ VIEW  Comparison: 11/07/2011  Findings: Irregularity of the right fifth metatarsal base noted with a subtle lucency consistent with a nondisplaced fracture. Normal alignment.  No other acute osseous finding.  Preserved joint spaces.  Mild soft tissue swelling laterally.  IMPRESSION: Small nondisplaced fracture right fifth metatarsal base  Original Report Authenticated By: Judie Petit. Ruel Favors, M.D.     1. Metatarsal bone fracture       MDM  xrays reviewed.  Pt fitted in cam walker,  Crutches given.  Oxycodone prescribed.  Referral to Dr. Romeo Apple for f/u care this week.  Ice,  Elevation encouraged.        Burgess Amor, PA 11/09/11 0046  Burgess Amor, PA 11/09/11 [phone]

## 2011-11-09 NOTE — Patient Instructions (Signed)
Wear brace x 6 weeks  

## 2011-11-09 NOTE — ED Provider Notes (Signed)
Medical screening examination/treatment/procedure(s) were performed by non-physician practitioner and as supervising physician I was immediately available for consultation/collaboration.   Hilmer Aliberti B. Bernette Mayers, MD 11/09/11 [phone]

## 2011-11-09 NOTE — Progress Notes (Signed)
Subjective:     Patient ID: Susan Potts, female   DOB: [birth year], 51 y.o.   MRN: [id number] Chief Complaint  Patient presents with  . Foot Injury    Right foot fracture. DOI 11-06-11.    Foot Injury    this is a 51 year old female with a history of injury to the right foot secondary to inversion injury on June 1. She was seen in the emergency room x-rays showed a small avulsion fracture of the fifth metatarsal. She is tender there as well as over the anterior talofibular ligament and is questioning me about what is wrong with her ankle. She describes sharp throbbing burning pain over the fifth metatarsal as well as the anterior talofibular ligament and distal tib-fib ligaments. She has 8/10 constant pain which is worse at night and worse with standing. It is partially relieved icing the leg. She does complain of some bruising and swelling of the right lower extremity. She has a history of fatigue and shortness of breath with cough tissues is a smoker. She has pain on inspiration heartburn and nausea as well as numbness and tingling. She status post back surgery. She denies other symptoms on review of systems. Her medical history is notable for COPD ulnar neuropathy carpal tunnel syndrome and fibromyalgia.  Past Medical History  Diagnosis Date  . PSVT (paroxysmal supraventricular tachycardia)     normal coronary angiography in 2006; records from EMS documented supraventricular tachycardia at approximately 195 bpm on 05/10/10  . Fibromyalgia   . Degenerative disc disease     LS spine discectomy and fusion; parasternal rod  . Carpal tunnel syndrome   . Hyperlipidemia   . Ulnar neuritis   . Tobacco abuse     40 pack years; one pack per day  . Gastroesophageal reflux disease   . COPD (chronic obstructive pulmonary disease)   . Neuropathy     Past Surgical History  Procedure Date  . Lumbar disc surgery 07/2010  . Ganglion cyst excision 2009    Right wrist  . Cesarean section 1995  .  Tubal ligation 1995  . Carpal tunnel release 2009    Right  . Spinal fixation surgery w/ implant     L4, L5    Review of Systems     Objective:   Physical Exam BP 100/58  Ht 5\' 7"  (1.702 m)  Wt 178 lb (80.74 kg)  BMI 27.88 kg/m2  Exam reveals a normal appearance with normal orientation x3 normal mood and affect normal station with abnormal ambulation pattern secondary to use of crutches and a Cam Walker. Her anterior talofibular ligament is tender the fifth metatarsal is tender there is swelling over both areas has decreased dorsiflexion of the foot limited to +5. The anterior drawer maneuver however is positive and I was able to get a good exam. She has global weakness of the ankle with resistance secondary to painful range of motion. The skin is bruised but intact pulses are normal. There is no lymphadenopathy the sensation is normal. No pathologic reflexes balance is good.    Assessment:     X-rays do not show any ankle fracture there is soft tissue swelling around the ankle joint as well as the fifth metatarsal which shows a small avulsion fracture. The ankle sprain is somewhat on the high Friday. So I anticipate a longer time for healing along with his fifth metatarsal fracture. She will continue in the Cam Walker weightbearing as tolerated.    Plan:  6 weeks brace and weight-bear as tolerated in Cam Walker

## 2011-11-17 NOTE — ED Provider Notes (Signed)
Medical screening examination/treatment/procedure(s) were performed by non-physician practitioner and as supervising physician I was immediately available for consultation/collaboration.   Glynn Octave, MD 11/17/11 [phone]

## 2011-11-22 ENCOUNTER — Encounter (HOSPITAL_COMMUNITY): Payer: Self-pay | Admitting: *Deleted

## 2011-11-22 ENCOUNTER — Emergency Department (HOSPITAL_COMMUNITY)
Admission: EM | Admit: 2011-11-22 | Discharge: 2011-11-22 | Disposition: A | Discharge status: Home / Self Care | Payer: Medicare Other | Attending: Emergency Medicine | Admitting: Emergency Medicine

## 2011-11-22 DIAGNOSIS — J449 Chronic obstructive pulmonary disease, unspecified: Secondary | ICD-10-CM | POA: Insufficient documentation

## 2011-11-22 DIAGNOSIS — M545 Low back pain, unspecified: Secondary | ICD-10-CM | POA: Insufficient documentation

## 2011-11-22 DIAGNOSIS — J4489 Other specified chronic obstructive pulmonary disease: Secondary | ICD-10-CM | POA: Insufficient documentation

## 2011-11-22 DIAGNOSIS — E785 Hyperlipidemia, unspecified: Secondary | ICD-10-CM | POA: Insufficient documentation

## 2011-11-22 DIAGNOSIS — G8929 Other chronic pain: Secondary | ICD-10-CM

## 2011-11-22 HISTORY — DX: Cervicalgia: M54.2

## 2011-11-22 HISTORY — DX: Dorsalgia, unspecified: M54.9

## 2011-11-22 HISTORY — DX: Other chronic pain: G89.29

## 2011-11-22 MED ORDER — OXYCODONE-ACETAMINOPHEN 5-325 MG PO TABS
1.0000 | ORAL_TABLET | Freq: Once | ORAL | Status: AC
  Administered 2011-11-22: 1 via ORAL
  Filled 2011-11-22: qty 1

## 2011-11-22 MED ORDER — METHOCARBAMOL 500 MG PO TABS: 1000.0000 mg | ORAL_TABLET | Freq: Four times a day (QID) | ORAL | Status: AC | PRN

## 2011-11-22 MED ORDER — OXYCODONE-ACETAMINOPHEN 5-325 MG PO TABS: ORAL_TABLET | ORAL | Status: AC

## 2011-11-22 MED ORDER — ONDANSETRON 8 MG PO TBDP
8.0000 mg | ORAL_TABLET | Freq: Once | ORAL | Status: AC
  Administered 2011-11-22: 8 mg via ORAL
  Filled 2011-11-22: qty 1

## 2011-11-22 NOTE — ED Provider Notes (Signed)
History     CSN: [id number]  Arrival date & time 11/22/11  1135   First MD Initiated Contact with Patient 11/22/11 1152      Chief Complaint  Patient presents with  . Back Pain     HPI Pt was seen at 1200.  Per pt, c/o gradual onset and persistence of constant acute flair of her chronic low back "pain" for the past several days.  Denies any change in her usual chronic pain pattern.  Pain worsens with palpation of the area and body position changes.  States she has an appt to see her Neurosurgeon Dr. Lovell Sheehan next week.  Denies incont/retention of bowel or bladder, no saddle anesthesia, no focal motor weakness, no tingling/numbness in extremities, no fevers, no injury.   The symptoms have been associated with no other complaints. The patient has a significant history of similar symptoms previously, recently being evaluated for this complaint and multiple prior evals for same.     Past Medical History  Diagnosis Date  . PSVT (paroxysmal supraventricular tachycardia)     normal coronary angiography in 2006; records from EMS documented supraventricular tachycardia at approximately 195 bpm on 05/10/10  . Fibromyalgia   . Degenerative disc disease     LS spine discectomy and fusion; parasternal rod  . Carpal tunnel syndrome   . Hyperlipidemia   . Ulnar neuritis   . Tobacco abuse     40 pack years; one pack per day  . Gastroesophageal reflux disease   . COPD (chronic obstructive pulmonary disease)   . Neuropathy   . Chronic neck pain   . Chronic back pain     Past Surgical History  Procedure Date  . Lumbar disc surgery 07/2010  . Ganglion cyst excision 2009    Right wrist  . Cesarean section 1995  . Tubal ligation 1995  . Carpal tunnel release 2009    Right  . Spinal fixation surgery w/ implant     L4, L5    Family History  Problem Relation Age of Onset  . Arthritis Other   . Stroke Other   . Cancer Mother   . Diabetes Mother   . Hypertension Mother   . Thyroid disease  Mother   . Diabetes Brother   . Hypertension Brother     History  Substance Use Topics  . Smoking status: Current Everyday Smoker -- 1.0 packs/day for 43 years    Types: Cigarettes  . Smokeless tobacco: Never Used  . Alcohol Use: No    OB History    Grav Para Term Preterm Abortions TAB SAB Ect Mult Living   2 2 2       2       Review of Systems ROS: Statement: All systems negative except as marked or noted in the HPI; Constitutional: Negative for fever and chills. ; ; Eyes: Negative for eye pain, redness and discharge. ; ; ENMT: Negative for ear pain, hoarseness, nasal congestion, sinus pressure and sore throat. ; ; Cardiovascular: Negative for chest pain, palpitations, diaphoresis, dyspnea and peripheral edema. ; ; Respiratory: Negative for cough, wheezing and stridor. ; ; Gastrointestinal: Negative for nausea, vomiting, diarrhea, abdominal pain, blood in stool, hematemesis, jaundice and rectal bleeding. . ; ; Genitourinary: Negative for dysuria, flank pain and hematuria. ; ; Musculoskeletal: +LBP.  Negative for neck pain. Negative for swelling and trauma.; ; Skin: Negative for pruritus, rash, abrasions, blisters, bruising and skin lesion.; ; Neuro: Negative for headache, lightheadedness and neck stiffness. Negative for  weakness, altered level of consciousness , altered mental status, extremity weakness, paresthesias, involuntary movement, seizure and syncope.       Allergies  Sulfonamide derivatives  Home Medications   Current Outpatient Rx  Name Route Sig Dispense Refill  . ALPRAZOLAM 0.25 MG PO TABS Oral Take 0.25 mg by mouth 3 (three) times daily as needed. anxiety    . BUFFERED C POWDER PO Oral Take 1 packet by mouth daily as needed. Takes for migraines     . BC FAST PAIN RELIEF PO Oral Take 1 packet by mouth daily as needed. Migraine     . CYCLOBENZAPRINE HCL 10 MG PO TABS Oral Take 10 mg by mouth 3 (three) times daily as needed.     Marland Kitchen DEXAMETHASONE 6 MG PO TABS  1 po bid  with food 12 tablet 0  . DIAZEPAM 5 MG PO TABS Oral Take 5 mg by mouth every 6 (six) hours as needed.      Marland Kitchen DILTIAZEM HCL 120 MG PO TABS Oral Take 1 tablet (120 mg total) by mouth daily. 30 tablet 6  . DULOXETINE HCL 60 MG PO CPEP Oral Take 60 mg by mouth daily.     Marland Kitchen HYDROCODONE-ACETAMINOPHEN 10-325 MG PO TABS Oral Take 1 tablet by mouth 4 (four) times daily.     Marland Kitchen METHOCARBAMOL 750 MG PO TABS Oral Take 750 mg by mouth 3 (three) times daily.     Marland Kitchen ONDANSETRON HCL 8 MG PO TABS Oral Take 8 mg by mouth 3 (three) times daily. For nausea    . OXYCODONE-ACETAMINOPHEN 10-325 MG PO TABS Oral Take 1 tablet by mouth every 4 (four) hours as needed.     Marland Kitchen PANTOPRAZOLE SODIUM 40 MG PO TBEC Oral Take 40 mg by mouth 2 (two) times daily.     Marland Kitchen PROMETHAZINE HCL 25 MG PO TABS Oral Take 25 mg by mouth every 6 (six) hours as needed.     Marland Kitchen PROMETHAZINE-CODEINE 6.25-10 MG/5ML PO SYRP Oral Take by mouth as needed.      Lenn Sink CF PO Oral Take 10 mLs by mouth 2 (two) times daily. Cough      BP 100/73  Pulse 69  Temp 97.7 F (36.5 C) (Oral)  Resp 20  Ht 5\' 7"  (1.702 m)  Wt 170 lb (77.111 kg)  BMI 26.63 kg/m2  SpO2 99%  Physical Exam 1205: Physical examination:  Nursing notes reviewed; Vital signs and O2 SAT reviewed;  Constitutional: Well developed, Well nourished, Well hydrated, In no acute distress; Head:  Normocephalic, atraumatic; Eyes: EOMI, PERRL, No scleral icterus; ENMT: Mouth and pharynx normal, Mucous membranes moist; Neck: Supple, Full range of motion, No lymphadenopathy; Cardiovascular: Regular rate and rhythm, No murmur, rub, or gallop; Respiratory: Breath sounds clear & equal bilaterally, No rales, rhonchi, wheezes.  Speaking full sentences with ease, Normal respiratory effort/excursion; Chest: Nontender, Movement normal; Abdomen: Soft, Nontender, Nondistended, Normal bowel sounds; Genitourinary: No CVA tenderness; Spine:  No midline CS, TS, LS tenderness.  +TTP left lumbar paraspinal muscles;  Extremities: +cam walker RLE.  Pulses normal, No tenderness, No edema, No calf edema or asymmetry.; Neuro: AA&Ox3, Major CN grossly intact.  Speech clear. Strength 5/5 equal bilat UE's and LE's, including great toe dorsiflexion.  DTR 2/4 equal bilat UE's and LE's.  No gross sensory deficits.  Neg straight leg raises bilat.; Skin: Color normal, Warm, Dry.   ED Course  Procedures   MDM  MDM Reviewed: previous chart, nursing note and vitals Reviewed previous:  MRI       12:06 PM:  Long hx of chronic pain with multiple ED visits for same.  Pt endorses acute flair of her usual long standing chronic pain today, no change from her usual chronic pain pattern.  Pt encouraged to f/u with her PMD, Neurosurgeon and/or Pain Management doctor for good continuity of care and control of her chronic pain.  Verb understanding.        Laray Anger, DO 11/22/11 Merrily Brittle

## 2011-11-22 NOTE — ED Notes (Addendum)
Low back pain since yesterday when bent over. , down lt leg  Has cam walker on rt leg,due to broken foot and sprained ankle .

## 2011-11-22 NOTE — Discharge Instructions (Signed)
RESOURCE GUIDE  Chronic Pain Problems: Contact [facility]  [phone] Patients need to be referred by their primary care doctor.  Insufficient Money for Medicine: Contact United Way:  call "211" or [facility] [phone].  No Primary Care Doctor: - Call [facility]  [phone] - can help you locate a primary care doctor that  accepts your insurance, provides certain services, etc. - Physician Referral Service- 1-800-533-3463  Agencies that provide inexpensive medical care: - [facility]  [phone] - [facility]  [phone] - [facility]  [phone] - [facility]  [phone] - Planned Parenthood  [phone] - [facility]  [phone]  Medicaid-accepting [place] Providers: - [facility]- [address]  [phone], Mon-Fri 9am-7pm, Sat 9am-1pm - [facility]- [address], [address]  [phone] - [facility]- [address], [address]  [phone] - [facility]- [address] [place] [address]  [phone] - Veita B[facility]- [address], [address], [phone]  Only accepts [place] Access Medicaid patients after they have their name  applied to their card  Self Pay (no insurance) in [place]: - Sickle Cell Patients: Dr Eric Dean, [facility]  [address], [phone] - [facility]- [address]  [phone]       -     [facility] [place]- [address]       -     [facility]- see information above (Speak to Pam H if you do not have insurance)       -  [facility]- [address], [phone]       -  [facility] [place]- [address],  [phone]       -  [facility]- [address] [place] [address], [phone]       -  Dr Osei-Bonsu-  [address], [place], [phone]       -  [facility]- [address], [phone]       -  [facility]- [address] [place] [address], [phone], also [address], [phone]       -    [facility]- [address], [phone], 1st & 3rd Saturday   every month, 10am-1pm  1) Find a Doctor and Pay Out of Pocket Although you won't have to find out who is covered by your insurance plan, it is a good idea to ask around and get recommendations. You will then need to call the office and see if the doctor you have chosen will accept you as a new patient and what types of options they offer for patients who are self-pay. Some doctors offer discounts or will set up payment plans for their patients who do not have insurance, but you will need to ask so you aren't surprised when you get to your appointment.  2) Contact Your Local Health Department Not all health departments have doctors that can see patients for sick visits, but many do, so it is worth a call to see if yours does. If you don't know where your local health department is, you can check in your phone book. The CDC also has a tool to help you locate your state's health department, and many state websites also have   listings of all of their local health departments.  3) Find a Walk-in Clinic If your illness is not likely to be very severe or complicated, you may want to try a walk in clinic. These are popping up all over the country in pharmacies, drugstores, and shopping centers. They're usually staffed by nurse practitioners or physician assistants that have been trained to treat common illnesses and complaints. They're usually fairly quick and inexpensive. However, if you have serious medical issues or chronic medical problems, these are probably not your best option  STD Testing - [place] [facility] [place], [facility], [address], [place], phone [phone] or [phone].  Monday - Friday, call for an appointment. - [place]  [facility] [place], [facility] Clinic, [address], [place], phone [phone] or 1-877-539-9860.  Monday - Friday, call for an appointment.  Abuse/Neglect: - [place] Child Abuse Hotline (336) 641-3795 - [place] Child Abuse Hotline 800-378-5315 (After Hours)  Emergency Shelter:  [facility] (336) 271-5985  Maternity Homes: - [facility] (336) 275-9566 - [facility] (704) 372-4663  MRSA Hotline #:   832-7006  [place] Resources  [facility] [place]  [employer] [place] [facility] [address]                 [address]         [address]  [place]                                               [place]                              [place] Phone:  [phone]                                  Phone:  [phone]                   Phone:  [phone]  [place] [facility], [phone] - [place] Services - [facility]- 1-888-581-9988       -     [facility] in [place], [address],                                  336-349-4454, Insurance  [place] Child Abuse Hotline (336) 342-1394 or (336) 342-3537 (After Hours)   Behavioral Health Services  Substance Abuse Resources: - [facility]  336-882-2125 - [facility] 336-784-9470 - [facility] 336-285-9073 - [facility] 336-845-3988 - Residential & Outpatient Substance Abuse Program  800-659-3381  Psychological Services: -  Health  [phone] - [facility]  [phone] - [place] [facility], [address], [place], ACCESS LINE: 1-800-853-5163 or 336-641-4981, Http://www.[facility].com/services/adult.htm  Dental Assistance  If unable to pay or uninsured, contact:  [facility] or [place] [facility] to become qualified for the adult dental  clinic.  Patients with Medicaid:  [facility] [address], [phone], [phone]  If unable   to pay, or uninsured, contact [facility] 917-154-7213) or [facility] (734)167-8356 in [place], [phone] in [facility]) to become qualified for the adult dental clinic  Other Low-Cost Community Dental Services: - [facility]- [address] [place], [place], [place], [address], [phone], [address]. [phone], 2nd and 4th Thursday of the month at 6:30am.  10 clients each day by appointment, can sometimes see walk-in patients if someone does not show for an appointment. [facility]- [address] [place], [place], [address], [phone] - [facility]- [address], [place], [place], [address], [phone] [facility]- [phone] [facility] Health Department- [phone] [facility] [phone]    Take the prescriptions as directed.  Apply moist heat or ice to the area(s) of discomfort, for 15 minutes at a time, several times per day for the next few days.  Do not fall asleep on a heating or ice pack.  Call your regular medical doctor today to schedule a follow up appointment this week.  Keep your previously scheduled appointment with your Neurosurgeon next week.  Return to the Emergency Department immediately if worsening.

## 2011-12-22 ENCOUNTER — Ambulatory Visit (INDEPENDENT_AMBULATORY_CARE_PROVIDER_SITE_OTHER): Payer: Medicare Other | Admitting: Orthopedic Surgery

## 2011-12-22 ENCOUNTER — Encounter: Payer: Self-pay | Admitting: Orthopedic Surgery

## 2011-12-22 VITALS — BP 108/60 | Ht 67.0 in | Wt 170.0 lb

## 2011-12-22 DIAGNOSIS — S93409A Sprain of unspecified ligament of unspecified ankle, initial encounter: Secondary | ICD-10-CM

## 2011-12-22 DIAGNOSIS — S92309A Fracture of unspecified metatarsal bone(s), unspecified foot, initial encounter for closed fracture: Secondary | ICD-10-CM

## 2011-12-22 NOTE — Progress Notes (Signed)
Subjective:     Patient ID: Susan Potts, female   DOB: 25-Jun-1960, 51 y.o.   MRN: [id number] Chief Complaint  Patient presents with  . Follow-up    6 week recheck right ankle    HPI Metatarsal avulsion fracture, RIGHT foot as well.  6 weeks Cam Walker. Removed Cam Walker on the 7th.  Review of Systems     Objective:   Physical Exam Both ankle joints are loose. Ankle is still somewhat tender. Minimal swelling. She has a very flat foot, chronic. Neurovascular exam unchanged and intact. Skin intact. Minor tenderness over the ankle joint, none over the fibular none over the metatarsal otherwise, normal appearance. She is oriented x3. Mood and affect are normal   Assessment:     No additional x-rays were needed. Ankle sprain, 5th metatarsal fracture.    Plan:     Activities as tolerated

## 2011-12-22 NOTE — Patient Instructions (Addendum)
activities as tolerated  Ankle exercises x 6 weeks

## 2012-01-19 ENCOUNTER — Other Ambulatory Visit: Payer: Self-pay | Admitting: Cardiology

## 2012-01-19 NOTE — Telephone Encounter (Signed)
NEEDS A FOLLOW UP VISIT

## 2012-01-20 ENCOUNTER — Ambulatory Visit: Payer: Medicare Other | Admitting: Orthopedic Surgery

## 2012-04-10 ENCOUNTER — Other Ambulatory Visit: Payer: Self-pay | Admitting: Cardiology

## 2012-08-01 ENCOUNTER — Other Ambulatory Visit: Payer: Self-pay | Admitting: Neurosurgery

## 2012-08-01 DIAGNOSIS — M549 Dorsalgia, unspecified: Secondary | ICD-10-CM

## 2012-08-08 ENCOUNTER — Other Ambulatory Visit: Payer: Medicare Other

## 2012-08-31 ENCOUNTER — Ambulatory Visit
Admission: RE | Admit: 2012-08-31 | Discharge: 2012-08-31 | Disposition: A | Discharge status: Home / Self Care | Payer: Medicare Other | Source: Ambulatory Visit | Attending: Neurosurgery | Admitting: Neurosurgery

## 2012-08-31 DIAGNOSIS — M549 Dorsalgia, unspecified: Secondary | ICD-10-CM

## 2012-08-31 MED ORDER — GADOBENATE DIMEGLUMINE 529 MG/ML IV SOLN
15.0000 mL | Freq: Once | INTRAVENOUS | Status: AC | PRN
  Administered 2012-08-31: 15 mL via INTRAVENOUS

## 2012-09-06 ENCOUNTER — Other Ambulatory Visit: Payer: Self-pay | Admitting: Neurosurgery

## 2012-09-11 ENCOUNTER — Encounter (HOSPITAL_COMMUNITY): Payer: Self-pay | Admitting: Pharmacy Technician

## 2012-09-12 ENCOUNTER — Encounter (HOSPITAL_COMMUNITY): Payer: Self-pay

## 2012-09-12 ENCOUNTER — Encounter (HOSPITAL_COMMUNITY)
Admission: RE | Admit: 2012-09-12 | Discharge: 2012-09-12 | Disposition: A | Discharge status: Home / Self Care | Payer: Medicare Other | Source: Ambulatory Visit | Attending: Neurosurgery | Admitting: Neurosurgery

## 2012-09-12 ENCOUNTER — Ambulatory Visit (HOSPITAL_COMMUNITY)
Admission: RE | Admit: 2012-09-12 | Discharge: 2012-09-12 | Disposition: A | Discharge status: Home / Self Care | Payer: Medicare Other | Source: Ambulatory Visit | Attending: Neurosurgery | Admitting: Neurosurgery

## 2012-09-12 DIAGNOSIS — J449 Chronic obstructive pulmonary disease, unspecified: Secondary | ICD-10-CM | POA: Insufficient documentation

## 2012-09-12 DIAGNOSIS — Z01810 Encounter for preprocedural cardiovascular examination: Secondary | ICD-10-CM | POA: Insufficient documentation

## 2012-09-12 DIAGNOSIS — I10 Essential (primary) hypertension: Secondary | ICD-10-CM | POA: Insufficient documentation

## 2012-09-12 DIAGNOSIS — Z01812 Encounter for preprocedural laboratory examination: Secondary | ICD-10-CM | POA: Insufficient documentation

## 2012-09-12 DIAGNOSIS — J4489 Other specified chronic obstructive pulmonary disease: Secondary | ICD-10-CM | POA: Insufficient documentation

## 2012-09-12 DIAGNOSIS — F17200 Nicotine dependence, unspecified, uncomplicated: Secondary | ICD-10-CM | POA: Insufficient documentation

## 2012-09-12 DIAGNOSIS — Z01818 Encounter for other preprocedural examination: Secondary | ICD-10-CM | POA: Insufficient documentation

## 2012-09-12 HISTORY — DX: Shortness of breath: R06.02

## 2012-09-12 LAB — CBC
MCH: 31.8 pg (ref 26.0–34.0)
MCV: 91.1 fL (ref 78.0–100.0)
Platelets: 205 10*3/uL (ref 150–400)
RDW: 12.6 % (ref 11.5–15.5)

## 2012-09-12 LAB — SURGICAL PCR SCREEN: MRSA, PCR: NEGATIVE

## 2012-09-12 LAB — BASIC METABOLIC PANEL
BUN: 15 mg/dL (ref 6–23)
CO2: 26 mEq/L (ref 19–32)
Calcium: 9.2 mg/dL (ref 8.4–10.5)
Creatinine, Ser: 0.6 mg/dL (ref 0.50–1.10)
Glucose, Bld: 109 mg/dL — ABNORMAL HIGH (ref 70–99)

## 2012-09-12 NOTE — Pre-Procedure Instructions (Signed)
Susan Potts  09/12/2012   Your procedure is scheduled on:  09/14/12  Report to [facility] at 1115 AM.  Call this number if you have problems the morning of surgery: [phone]   Remember:   Do not eat food or drink liquids after midnight.   Take these medicines the morning of surgery with A SIP OF WATER: *valium,cardizem,hydrocodone,flexeril   Do not wear jewelry, make-up or nail polish.  Do not wear lotions, powders, or perfumes. You may wear deodorant.  Do not shave 48 hours prior to surgery. Men may shave face and neck.  Do not bring valuables to the hospital.  Contacts, dentures or bridgework may not be worn into surgery.  Leave suitcase in the car. After surgery it may be brought to your room.  For patients admitted to the hospital, checkout time is 11:00 AM the day of  discharge.   Patients discharged the day of surgery will not be allowed to drive  home.  Name and phone number of your driver: family  Special Instructions: Shower using CHG 2 nights before surgery and the night before surgery.  If you shower the day of surgery use CHG.  Use special wash - you have one bottle of CHG for all showers.  You should use approximately 1/3 of the bottle for each shower.   Please read over the following fact sheets that you were given: Pain Booklet, Coughing and Deep Breathing, MRSA Information and Surgical Site Infection Prevention

## 2012-09-13 MED ORDER — CEFAZOLIN SODIUM-DEXTROSE 2-3 GM-% IV SOLR
2.0000 g | INTRAVENOUS | Status: AC
  Administered 2012-09-14: 2 g via INTRAVENOUS
  Filled 2012-09-13: qty 50

## 2012-09-14 ENCOUNTER — Inpatient Hospital Stay (HOSPITAL_COMMUNITY)
Admission: RE | Admit: 2012-09-14 | Discharge: 2012-09-14 | DRG: 491 | Disposition: A | Discharge status: Home / Self Care | Payer: PRIVATE HEALTH INSURANCE | Source: Ambulatory Visit | Attending: Neurosurgery | Admitting: Neurosurgery

## 2012-09-14 ENCOUNTER — Encounter (HOSPITAL_COMMUNITY)
Admission: RE | Disposition: A | Discharge status: Home / Self Care | Payer: Self-pay | Source: Ambulatory Visit | Attending: Neurosurgery

## 2012-09-14 ENCOUNTER — Ambulatory Visit (HOSPITAL_COMMUNITY): Payer: PRIVATE HEALTH INSURANCE | Admitting: Certified Registered"

## 2012-09-14 ENCOUNTER — Encounter (HOSPITAL_COMMUNITY): Payer: Self-pay | Admitting: Certified Registered Nurse Anesthetist

## 2012-09-14 ENCOUNTER — Ambulatory Visit (HOSPITAL_COMMUNITY): Payer: PRIVATE HEALTH INSURANCE

## 2012-09-14 ENCOUNTER — Encounter (HOSPITAL_COMMUNITY): Payer: Self-pay | Admitting: Certified Registered"

## 2012-09-14 DIAGNOSIS — G8929 Other chronic pain: Secondary | ICD-10-CM | POA: Diagnosis present

## 2012-09-14 DIAGNOSIS — Z8261 Family history of arthritis: Secondary | ICD-10-CM

## 2012-09-14 DIAGNOSIS — E785 Hyperlipidemia, unspecified: Secondary | ICD-10-CM | POA: Diagnosis present

## 2012-09-14 DIAGNOSIS — Z981 Arthrodesis status: Secondary | ICD-10-CM

## 2012-09-14 DIAGNOSIS — K219 Gastro-esophageal reflux disease without esophagitis: Secondary | ICD-10-CM | POA: Diagnosis present

## 2012-09-14 DIAGNOSIS — Z823 Family history of stroke: Secondary | ICD-10-CM

## 2012-09-14 DIAGNOSIS — F17200 Nicotine dependence, unspecified, uncomplicated: Secondary | ICD-10-CM | POA: Diagnosis present

## 2012-09-14 DIAGNOSIS — J449 Chronic obstructive pulmonary disease, unspecified: Secondary | ICD-10-CM | POA: Diagnosis present

## 2012-09-14 DIAGNOSIS — Z8249 Family history of ischemic heart disease and other diseases of the circulatory system: Secondary | ICD-10-CM

## 2012-09-14 DIAGNOSIS — Z833 Family history of diabetes mellitus: Secondary | ICD-10-CM

## 2012-09-14 DIAGNOSIS — M5126 Other intervertebral disc displacement, lumbar region: Principal | ICD-10-CM | POA: Diagnosis present

## 2012-09-14 DIAGNOSIS — Z882 Allergy status to sulfonamides status: Secondary | ICD-10-CM

## 2012-09-14 DIAGNOSIS — J4489 Other specified chronic obstructive pulmonary disease: Secondary | ICD-10-CM | POA: Diagnosis present

## 2012-09-14 HISTORY — PX: LUMBAR LAMINECTOMY/DECOMPRESSION MICRODISCECTOMY: SHX5026

## 2012-09-14 SURGERY — LUMBAR LAMINECTOMY/DECOMPRESSION MICRODISCECTOMY 1 LEVEL
Anesthesia: General | Laterality: Left | Wound class: Clean

## 2012-09-14 MED ORDER — PROPOFOL 10 MG/ML IV BOLUS
INTRAVENOUS | Status: DC | PRN
  Administered 2012-09-14: 200 mg via INTRAVENOUS

## 2012-09-14 MED ORDER — HEMOSTATIC AGENTS (NO CHARGE) OPTIME
TOPICAL | Status: DC | PRN
  Administered 2012-09-14: 1 via TOPICAL

## 2012-09-14 MED ORDER — NEOSTIGMINE METHYLSULFATE 1 MG/ML IJ SOLN
INTRAMUSCULAR | Status: DC | PRN
  Administered 2012-09-14: 4 mg via INTRAVENOUS

## 2012-09-14 MED ORDER — SODIUM CHLORIDE 0.9 % IV SOLN
INTRAVENOUS | Status: AC
  Filled 2012-09-14: qty 500

## 2012-09-14 MED ORDER — DOCUSATE SODIUM 100 MG PO CAPS: 100.0000 mg | ORAL_CAPSULE | Freq: Two times a day (BID) | ORAL | Status: DC

## 2012-09-14 MED ORDER — ACETAMINOPHEN 325 MG PO TABS: 650.0000 mg | ORAL_TABLET | ORAL | Status: DC | PRN

## 2012-09-14 MED ORDER — LIDOCAINE HCL 4 % MT SOLN
OROMUCOSAL | Status: DC | PRN
  Administered 2012-09-14: 4 mL via TOPICAL

## 2012-09-14 MED ORDER — HYDROMORPHONE HCL PF 1 MG/ML IJ SOLN
0.2500 mg | INTRAMUSCULAR | Status: DC | PRN
  Administered 2012-09-14: 0.5 mg via INTRAVENOUS

## 2012-09-14 MED ORDER — LIDOCAINE HCL (CARDIAC) 20 MG/ML IV SOLN
INTRAVENOUS | Status: DC | PRN
  Administered 2012-09-14: 100 mg via INTRAVENOUS

## 2012-09-14 MED ORDER — FENTANYL CITRATE 0.05 MG/ML IJ SOLN
INTRAMUSCULAR | Status: DC | PRN
  Administered 2012-09-14: 100 ug via INTRAVENOUS
  Administered 2012-09-14: 150 ug via INTRAVENOUS

## 2012-09-14 MED ORDER — HYDROCODONE-ACETAMINOPHEN 5-325 MG PO TABS: 1.0000 | ORAL_TABLET | ORAL | Status: DC | PRN

## 2012-09-14 MED ORDER — MENTHOL 3 MG MT LOZG: 1.0000 | LOZENGE | OROMUCOSAL | Status: DC | PRN

## 2012-09-14 MED ORDER — BACITRACIN 50000 UNITS IM SOLR
INTRAMUSCULAR | Status: AC
  Filled 2012-09-14: qty 1

## 2012-09-14 MED ORDER — ONDANSETRON HCL 4 MG/2ML IJ SOLN: 4.0000 mg | Freq: Once | INTRAMUSCULAR | Status: DC | PRN

## 2012-09-14 MED ORDER — MORPHINE SULFATE 2 MG/ML IJ SOLN: 1.0000 mg | INTRAMUSCULAR | Status: DC | PRN

## 2012-09-14 MED ORDER — OXYCODONE-ACETAMINOPHEN 10-325 MG PO TABS: 1.0000 | ORAL_TABLET | ORAL | Status: DC | PRN

## 2012-09-14 MED ORDER — GLYCOPYRROLATE 0.2 MG/ML IJ SOLN
INTRAMUSCULAR | Status: DC | PRN
  Administered 2012-09-14: 0.6 mg via INTRAVENOUS

## 2012-09-14 MED ORDER — LACTATED RINGERS IV SOLN
INTRAVENOUS | Status: DC | PRN
  Administered 2012-09-14 (×2): via INTRAVENOUS

## 2012-09-14 MED ORDER — 0.9 % SODIUM CHLORIDE (POUR BTL) OPTIME
TOPICAL | Status: DC | PRN
  Administered 2012-09-14: 1000 mL

## 2012-09-14 MED ORDER — ONDANSETRON HCL 4 MG/2ML IJ SOLN: 4.0000 mg | INTRAMUSCULAR | Status: DC | PRN

## 2012-09-14 MED ORDER — ONDANSETRON HCL 4 MG/2ML IJ SOLN
INTRAMUSCULAR | Status: DC | PRN
  Administered 2012-09-14: 4 mg via INTRAVENOUS

## 2012-09-14 MED ORDER — BUPIVACAINE-EPINEPHRINE PF 0.5-1:200000 % IJ SOLN
INTRAMUSCULAR | Status: DC | PRN
  Administered 2012-09-14: 10 mL

## 2012-09-14 MED ORDER — CEFAZOLIN SODIUM-DEXTROSE 2-3 GM-% IV SOLR
2.0000 g | Freq: Three times a day (TID) | INTRAVENOUS | Status: DC
  Administered 2012-09-14: 2 g via INTRAVENOUS
  Filled 2012-09-14 (×2): qty 50

## 2012-09-14 MED ORDER — EPHEDRINE SULFATE 50 MG/ML IJ SOLN
INTRAMUSCULAR | Status: DC | PRN
  Administered 2012-09-14 (×3): 10 mg via INTRAVENOUS
  Administered 2012-09-14: 5 mg via INTRAVENOUS
  Administered 2012-09-14: 10 mg via INTRAVENOUS

## 2012-09-14 MED ORDER — DILTIAZEM HCL 60 MG PO TABS: 120.0000 mg | ORAL_TABLET | Freq: Every day | ORAL | Status: DC

## 2012-09-14 MED ORDER — LACTATED RINGERS IV SOLN: INTRAVENOUS | Status: DC

## 2012-09-14 MED ORDER — HYDROMORPHONE HCL PF 1 MG/ML IJ SOLN
INTRAMUSCULAR | Status: AC
  Filled 2012-09-14: qty 1

## 2012-09-14 MED ORDER — ROCURONIUM BROMIDE 100 MG/10ML IV SOLN
INTRAVENOUS | Status: DC | PRN
  Administered 2012-09-14: 10 mg via INTRAVENOUS
  Administered 2012-09-14: 20 mg via INTRAVENOUS
  Administered 2012-09-14: 30 mg via INTRAVENOUS

## 2012-09-14 MED ORDER — DIAZEPAM 5 MG PO TABS: 5.0000 mg | ORAL_TABLET | Freq: Four times a day (QID) | ORAL | Status: DC | PRN

## 2012-09-14 MED ORDER — SODIUM CHLORIDE 0.9 % IR SOLN
Status: DC | PRN
  Administered 2012-09-14: 09:00:00

## 2012-09-14 MED ORDER — PHENYLEPHRINE HCL 10 MG/ML IJ SOLN
INTRAMUSCULAR | Status: DC | PRN
  Administered 2012-09-14 (×4): 40 ug via INTRAVENOUS
  Administered 2012-09-14 (×2): 80 ug via INTRAVENOUS
  Administered 2012-09-14 (×3): 40 ug via INTRAVENOUS

## 2012-09-14 MED ORDER — DIAZEPAM 5 MG PO TABS
5.0000 mg | ORAL_TABLET | Freq: Four times a day (QID) | ORAL | Status: DC | PRN
  Filled 2012-09-14: qty 1

## 2012-09-14 MED ORDER — ALUM & MAG HYDROXIDE-SIMETH 200-200-20 MG/5ML PO SUSP: 30.0000 mL | Freq: Four times a day (QID) | ORAL | Status: DC | PRN

## 2012-09-14 MED ORDER — PHENOL 1.4 % MT LIQD: 1.0000 | OROMUCOSAL | Status: DC | PRN

## 2012-09-14 MED ORDER — DIAZEPAM 5 MG PO TABS
5.0000 mg | ORAL_TABLET | Freq: Four times a day (QID) | ORAL | Status: DC | PRN
  Administered 2012-09-14: 5 mg via ORAL

## 2012-09-14 MED ORDER — OXYCODONE-ACETAMINOPHEN 5-325 MG PO TABS
1.0000 | ORAL_TABLET | ORAL | Status: DC | PRN
  Administered 2012-09-14: 2 via ORAL
  Filled 2012-09-14: qty 2

## 2012-09-14 MED ORDER — ARTIFICIAL TEARS OP OINT
TOPICAL_OINTMENT | OPHTHALMIC | Status: DC | PRN
  Administered 2012-09-14: 1 via OPHTHALMIC

## 2012-09-14 MED ORDER — THROMBIN 5000 UNITS EX SOLR
CUTANEOUS | Status: DC | PRN
  Administered 2012-09-14 (×2): 5000 [IU] via TOPICAL

## 2012-09-14 MED ORDER — ACETAMINOPHEN 650 MG RE SUPP: 650.0000 mg | RECTAL | Status: DC | PRN

## 2012-09-14 MED ORDER — BACITRACIN ZINC 500 UNIT/GM EX OINT
TOPICAL_OINTMENT | CUTANEOUS | Status: DC | PRN
  Administered 2012-09-14: 1 via TOPICAL

## 2012-09-14 MED ORDER — DILTIAZEM HCL ER COATED BEADS 120 MG PO CP24
120.0000 mg | ORAL_CAPSULE | Freq: Every day | ORAL | Status: DC
  Filled 2012-09-14: qty 1

## 2012-09-14 SURGICAL SUPPLY — 60 items
ADH SKN CLS APL DERMABOND .7 (GAUZE/BANDAGES/DRESSINGS) ×1
APL SKNCLS STERI-STRIP NONHPOA (GAUZE/BANDAGES/DRESSINGS) ×1
BAG DECANTER FOR FLEXI CONT (MISCELLANEOUS) ×2 IMPLANT
BENZOIN TINCTURE PRP APPL 2/3 (GAUZE/BANDAGES/DRESSINGS) ×2 IMPLANT
BLADE SURG ROTATE 9660 (MISCELLANEOUS) IMPLANT
BRUSH SCRUB EZ PLAIN DRY (MISCELLANEOUS) ×2 IMPLANT
BUR ACORN 6.0 (BURR) ×2 IMPLANT
BUR MATCHSTICK NEURO 3.0 LAGG (BURR) ×2 IMPLANT
CANISTER SUCTION 2500CC (MISCELLANEOUS) ×2 IMPLANT
CLOTH BEACON ORANGE TIMEOUT ST (SAFETY) ×2 IMPLANT
CONT SPEC 4OZ CLIKSEAL STRL BL (MISCELLANEOUS) ×2 IMPLANT
DERMABOND ADVANCED (GAUZE/BANDAGES/DRESSINGS) ×1
DERMABOND ADVANCED .7 DNX12 (GAUZE/BANDAGES/DRESSINGS) IMPLANT
DRAPE LAPAROTOMY 100X72X124 (DRAPES) ×2 IMPLANT
DRAPE MICROSCOPE LEICA (MISCELLANEOUS) ×2 IMPLANT
DRAPE POUCH INSTRU U-SHP 10X18 (DRAPES) ×2 IMPLANT
DRAPE SURG 17X23 STRL (DRAPES) ×8 IMPLANT
ELECT BLADE 4.0 EZ CLEAN MEGAD (MISCELLANEOUS) ×2
ELECT REM PT RETURN 9FT ADLT (ELECTROSURGICAL) ×2
ELECTRODE BLDE 4.0 EZ CLN MEGD (MISCELLANEOUS) ×1 IMPLANT
ELECTRODE REM PT RTRN 9FT ADLT (ELECTROSURGICAL) ×1 IMPLANT
GAUZE SPONGE 4X4 16PLY XRAY LF (GAUZE/BANDAGES/DRESSINGS) IMPLANT
GLOVE BIO SURGEON STRL SZ8.5 (GLOVE) ×2 IMPLANT
GLOVE BIOGEL PI IND STRL 7.0 (GLOVE) IMPLANT
GLOVE BIOGEL PI IND STRL 8 (GLOVE) IMPLANT
GLOVE BIOGEL PI INDICATOR 7.0 (GLOVE) ×3
GLOVE BIOGEL PI INDICATOR 8 (GLOVE) ×1
GLOVE EXAM NITRILE LRG STRL (GLOVE) ×1 IMPLANT
GLOVE EXAM NITRILE MD LF STRL (GLOVE) IMPLANT
GLOVE EXAM NITRILE XL STR (GLOVE) IMPLANT
GLOVE EXAM NITRILE XS STR PU (GLOVE) IMPLANT
GLOVE INDICATOR 7.5 STRL GRN (GLOVE) ×2 IMPLANT
GLOVE INDICATOR 8.0 STRL GRN (GLOVE) ×1 IMPLANT
GLOVE INDICATOR 8.5 STRL (GLOVE) ×1 IMPLANT
GLOVE SS BIOGEL STRL SZ 8 (GLOVE) ×1 IMPLANT
GLOVE SUPERSENSE BIOGEL SZ 8 (GLOVE) ×1
GOWN BRE IMP SLV AUR LG STRL (GOWN DISPOSABLE) IMPLANT
GOWN BRE IMP SLV AUR XL STRL (GOWN DISPOSABLE) ×3 IMPLANT
GOWN STRL REIN 2XL LVL4 (GOWN DISPOSABLE) IMPLANT
KIT BASIN OR (CUSTOM PROCEDURE TRAY) ×2 IMPLANT
KIT ROOM TURNOVER OR (KITS) ×2 IMPLANT
NDL HYPO 21X1.5 SAFETY (NEEDLE) IMPLANT
NEEDLE HYPO 21X1.5 SAFETY (NEEDLE) IMPLANT
NEEDLE HYPO 22GX1.5 SAFETY (NEEDLE) ×2 IMPLANT
NS IRRIG 1000ML POUR BTL (IV SOLUTION) ×2 IMPLANT
PACK LAMINECTOMY NEURO (CUSTOM PROCEDURE TRAY) ×2 IMPLANT
PAD ARMBOARD 7.5X6 YLW CONV (MISCELLANEOUS) ×6 IMPLANT
PATTIES SURGICAL .5 X1 (DISPOSABLE) IMPLANT
RUBBERBAND STERILE (MISCELLANEOUS) ×4 IMPLANT
SPONGE GAUZE 4X4 12PLY (GAUZE/BANDAGES/DRESSINGS) ×2 IMPLANT
SPONGE SURGIFOAM ABS GEL SZ50 (HEMOSTASIS) ×2 IMPLANT
STRIP CLOSURE SKIN 1/2X4 (GAUZE/BANDAGES/DRESSINGS) ×2 IMPLANT
SUT VIC AB 1 CT1 18XBRD ANBCTR (SUTURE) ×1 IMPLANT
SUT VIC AB 1 CT1 8-18 (SUTURE) ×2
SUT VIC AB 2-0 CP2 18 (SUTURE) ×2 IMPLANT
SYR 20CC LL (SYRINGE) IMPLANT
SYR 20ML ECCENTRIC (SYRINGE) ×2 IMPLANT
TOWEL OR 17X24 6PK STRL BLUE (TOWEL DISPOSABLE) ×2 IMPLANT
TOWEL OR 17X26 10 PK STRL BLUE (TOWEL DISPOSABLE) ×2 IMPLANT
WATER STERILE IRR 1000ML POUR (IV SOLUTION) ×2 IMPLANT

## 2012-09-14 NOTE — H&P (Signed)
Subjective: The patient is a 52 year old white female who has had chronic neck and back troubles. She's undergone a prior L5-S1 fusion. She has developed back left buttock and leg pain consistent with a lumbar radiculopathy. She has failed medical management and was worked up with a lumbar MRI. This demonstrated a far lateral herniated disc at L3-4 on the left. I discussed the various treatment options with the patient including surgery. The patient has weighed the risks, benefits, and alternatives surgery and decided proceed with a left L3-4 far lateral discectomy.   Past Medical History  Diagnosis Date  . Fibromyalgia   . Degenerative disc disease     LS spine discectomy and fusion; parasternal rod  . Carpal tunnel syndrome   . Hyperlipidemia   . Ulnar neuritis   . Tobacco abuse     40 pack years; one pack per day  . Gastroesophageal reflux disease   . COPD (chronic obstructive pulmonary disease)   . Neuropathy   . Chronic neck pain   . Chronic back pain   . Shortness of breath   . PSVT (paroxysmal supraventricular tachycardia)     normal coronary angiography in 2006; records from EMS documented supraventricular tachycardia at approximately 195 bpm on 05/10/10    Past Surgical History  Procedure Laterality Date  . Lumbar disc surgery  07/2010  . Ganglion cyst excision  2009    Right wrist  . Cesarean section  1995  . Tubal ligation  1995  . Carpal tunnel release  2009    Right  . Spinal fixation surgery w/ implant      L4, L5  . Back surgery      Allergies  Allergen Reactions  . Sulfonamide Derivatives Anaphylaxis and Rash    History  Substance Use Topics  . Smoking status: Current Every Day Smoker -- 1.00 packs/day for 43 years    Types: Cigarettes  . Smokeless tobacco: Never Used  . Alcohol Use: No    Family History  Problem Relation Age of Onset  . Arthritis Other   . Stroke Other   . Cancer Mother   . Diabetes Mother   . Hypertension Mother   . Thyroid  disease Mother   . Diabetes Brother   . Hypertension Brother    Prior to Admission medications   Medication Sig Start Date End Date Taking? Authorizing Provider  Aspirin-Caffeine (BC FAST PAIN RELIEF PO) Take 1 packet by mouth 2 (two) times daily as needed (migraines).    Yes Historical Provider, MD  cyclobenzaprine (FLEXERIL) 10 MG tablet Take 10 mg by mouth 3 (three) times daily as needed for muscle spasms.    Yes Historical Provider, MD  diazepam (VALIUM) 5 MG tablet Take 5 mg by mouth every 6 (six) hours as needed for anxiety or sleep.    Yes Historical Provider, MD  diltiazem (CARDIZEM) 120 MG tablet Take 120 mg by mouth daily.   Yes Historical Provider, MD  HYDROcodone-acetaminophen (NORCO) 10-325 MG per tablet Take 1 tablet by mouth every 5 (five) hours as needed for pain.    Yes Historical Provider, MD  promethazine (PHENERGAN) 25 MG tablet Take 25 mg by mouth every 6 (six) hours as needed for nausea.    Yes Historical Provider, MD     Review of Systems  Positive ROS: As above  All other systems have been reviewed and were otherwise negative with the exception of those mentioned in the HPI and as above.  Objective: Vital signs in last  24 hours: Temp:  [98 F (36.7 C)] 98 F (36.7 C) (04/10 0730) Pulse Rate:  [90] 90 (04/10 0730) Resp:  [18] 18 (04/10 0730) BP: (108)/(74) 108/74 mmHg (04/10 0730) SpO2:  [94 %] 94 % (04/10 0730)  General Appearance: Alert, cooperative, no distress, appears stated age Head: Normocephalic, without obvious abnormality, atraumatic Eyes: PERRL, conjunctiva/corneas clear, EOM's intact, fundi benign, both eyes      Ears: Normal TM's and external ear canals, both ears Throat: Lips, mucosa, and tongue normal; teeth and gums normal Neck: Supple, symmetrical, trachea midline, no adenopathy; thyroid: No enlargement/tenderness/nodules; no carotid bruit or JVD Back: Symmetric, no curvature, ROM normal, no CVA tenderness. The patient's lumbar incision is  well-healed. Lungs: Clear to auscultation bilaterally, respirations unlabored Heart: Regular rate and rhythm, S1 and S2 normal, no murmur, rub or gallop Abdomen: Soft, non-tender, bowel sounds active all four quadrants, no masses, no organomegaly Extremities: Extremities normal, atraumatic, no cyanosis or edema Pulses: 2+ and symmetric all extremities Skin: Skin color, texture, turgor normal, no rashes or lesions  NEUROLOGIC:   Mental status: alert and oriented, no aphasia, good attention span, Fund of knowledge/ memory ok Motor Exam - grossly normal Sensory Exam - grossly normal Reflexes:  Coordination - grossly normal Gait - grossly normal Balance - grossly normal Cranial Nerves: I: smell Not tested  II: visual acuity  OS: Normal    OD: Normal   II: visual fields Full to confrontation  II: pupils Equal, round, reactive to light  III,VII: ptosis None  III,IV,VI: extraocular muscles  Full ROM  V: mastication Normal  V: facial light touch sensation  Normal  V,VII: corneal reflex  Present  VII: facial muscle function - upper  Normal  VII: facial muscle function - lower Normal  VIII: hearing Not tested  IX: soft palate elevation  Normal  IX,X: gag reflex Present  XI: trapezius strength  5/5  XI: sternocleidomastoid strength 5/5  XI: neck flexion strength  5/5  XII: tongue strength  Normal    Data Review Lab Results  Component Value Date   WBC 9.4 09/12/2012   HGB 15.3* 09/12/2012   HCT 43.8 09/12/2012   MCV 91.1 09/12/2012   PLT 205 09/12/2012   Lab Results  Component Value Date   NA 138 09/12/2012   K 4.7 09/12/2012   CL 103 09/12/2012   CO2 26 09/12/2012   BUN 15 09/12/2012   CREATININE 0.60 09/12/2012   GLUCOSE 109* 09/12/2012   No results found for this basename: INR, PROTIME    Assessment/Plan: Left L3-4 far lateral herniated disc, lumbago, lumbar radiculopathy: I discussed situation with the patient. I have reviewed her MR scan with her and pointed out the abnormalities. We  have discussed the various treatment options including surgery. I have described the surgical option of a left L3-4 far lateral discectomy. I have described the surgery to her. I've shown her surgical models. We have discussed the risks, benefits, alternatives, and likelihood of achieving our goals with surgery. I have answered all the patient's questions. She has decided to proceed with surgery.   David Towson D 09/14/2012 9:43 AM

## 2012-09-14 NOTE — Transfer of Care (Signed)
Immediate Anesthesia Transfer of Care Note  Patient: Susan Potts  Procedure(s) Performed: Procedure(s) with comments: LUMBAR LAMINECTOMY/DECOMPRESSION MICRODISCECTOMY 1 LEVEL (Left) - LEFT Lumbar three-four diskectomy  Patient Location: PACU  Anesthesia Type:General  Level of Consciousness: awake, alert , oriented and patient cooperative  Airway & Oxygen Therapy: Patient Spontanous Breathing and Patient connected to nasal cannula oxygen  Post-op Assessment: Report given to PACU RN, Post -op Vital signs reviewed and stable and Patient moving all extremities X 4  Post vital signs: Reviewed and stable  Complications: No apparent anesthesia complications

## 2012-09-14 NOTE — Progress Notes (Signed)
Subjective:  The patient is alert and pleasant. She looks well. She is in no apparent distress.  Objective: Vital signs in last 24 hours: Temp:  [97.5 F (36.4 C)-98 F (36.7 C)] 97.5 F (36.4 C) (04/10 1115) Pulse Rate:  [84-92] 84 (04/10 1128) Resp:  [18-62] 62 (04/10 1128) BP: (108-115)/(65-85) 115/65 mmHg (04/10 1128) SpO2:  [94 %-97 %] 97 % (04/10 1128)  Intake/Output from previous day:   Intake/Output this shift: Total I/O In: 1300 [I.V.:1300] Out: 50 [Blood:50]  Physical exam the patient is moving her lower extremities well.  Lab Results:  Recent Labs  09/12/12 0951  WBC 9.4  HGB 15.3*  HCT 43.8  PLT 205   BMET  Recent Labs  09/12/12 0951  NA 138  K 4.7  CL 103  CO2 26  GLUCOSE 109*  BUN 15  CREATININE 0.60  CALCIUM 9.2    Studies/Results: Dg Lumbar Spine 1 View  09/14/2012  *RADIOLOGY REPORT*  Clinical Data: L3-4 laminectomy.  LUMBAR SPINE - 1 VIEW  Comparison: MR lumbar spine 08/31/2012.  Findings: Postoperative change of L5-S1 fusion is again identified. Probe is in place and it is directed toward the L2-3 level.  IMPRESSION: L2-3 localization.   Original Report Authenticated By: Holley Dexter, M.D.     Assessment/Plan:  the patient is doing well.   LOS: 0 days     Susan Potts D 09/14/2012, 11:45 AM

## 2012-09-14 NOTE — Progress Notes (Signed)
Pt. Alert and oriented,follows simple instructions, denies pain. Incision area without swelling, redness or S/S of infection. Voiding adequate clear yellow urine. Moving all extremities well and vitals stable and documented. Lumbar surgery notes instructions given to patient and family member for home safety and precautions. Pt. and family stated understanding of instructions given.  

## 2012-09-14 NOTE — Anesthesia Postprocedure Evaluation (Signed)
  Anesthesia Post-op Note  Patient: Susan Potts  Procedure(s) Performed: Procedure(s) with comments: LUMBAR LAMINECTOMY/DECOMPRESSION MICRODISCECTOMY 1 LEVEL (Left) - LEFT Lumbar three-four diskectomy  Patient Location: PACU  Anesthesia Type:General  Level of Consciousness: awake, oriented and patient cooperative  Airway and Oxygen Therapy: Patient Spontanous Breathing  Post-op Pain: mild  Post-op Assessment: Post-op Vital signs reviewed, Patient's Cardiovascular Status Stable, Respiratory Function Stable, Patent Airway, No signs of Nausea or vomiting and Pain level controlled  Post-op Vital Signs: stable  Complications: No apparent anesthesia complications

## 2012-09-14 NOTE — Op Note (Signed)
Brief history: The patient is a 52 year old white female who complains of back and left leg pain consistent with a left lumbar radiculopathy. She has failed medical management and was worked up with a lumbar MRI. This demonstrated a far lateral herniated disc at L3-4 on the left. I discussed the various treatment options with the patient including surgery. She has weighed the risks, benefits, and alternatives surgery and decided proceed with a left L3-4 far lateral discectomy.  Preoperative diagnosis: Left L3-4 far lateral disc, lumbago, lumbar radiculopathy  Postoperative diagnosis: the same  Procedure: Left L3-4 far lateral Intervertebral discectomy using micro-dissection  Surgeon: Dr. Delma Officer  Asst.: Dr. Hilda Lias  Anesthesia: Gen. endotracheal  Estimated blood loss: Minimal  Drains: None  Complications: None  Description of procedure: The patient was brought to the operating room by the anesthesia team. General endotracheal anesthesia was induced. The patient was turned to the prone position on the Wilson frame. The patient's lumbosacral region was then prepared with Betadine scrub and Betadine solution. Sterile drapes were applied.  I then injected the area to be incised with Marcaine with epinephrine solution. I then used a scalpel to make a linear midline incision over the L3-4 intervertebral disc space. I then used electrocautery to perform a left sided subperiosteal dissection exposing the spinous process and lamina of L3 and L4. We obtained intraoperative radiograph to confirm our location. I then inserted the [facility] retractor for exposure.  We then brought the operative microscope into the field. Under its magnification and illumination we completed the microdissection. I used a high-speed drill to drill off the lateral aspect of the left L3-4 pars. I then carefully dissected through the interspinous ligament and identified the exiting L3 nerve root.. We then used  microdissection to free up the extraforaminal left L3 nerve root from the epidural tissue. I then dissected just caudal and ventral to the L3 nerve root and identified a far lateral herniated disc. I removed the with the micropituitary forceps decompressing the L3 nerve root.  I then palpated along the ventral surface of the thecal sac and along exit route of the L3 nerve root and noted that the neural structures were well decompressed. This completed the decompression.  We then obtained hemostasis using bipolar electrocautery. We irrigated the wound out with bacitracin solution. We then removed the retractor. We then reapproximated the patient's thoracolumbar fascia with interrupted #1 Vicryl suture. We then reapproximated the patient's subcutaneous tissue with interrupted 3-0 Vicryl suture. We then reapproximated patient's skin with Steri-Strips and benzoin. The was then coated with bacitracin ointment. The drapes were removed. The patient was subsequently returned to the supine position where they were extubated by the anesthesia team. The patient was then transported to the postanesthesia care unit in stable condition. All sponge instrument and needle counts were reportedly correct at the end of this case.

## 2012-09-14 NOTE — Anesthesia Preprocedure Evaluation (Addendum)
Anesthesia Evaluation  Patient identified by MRN, date of birth, ID band Patient awake    Reviewed: Allergy & Precautions, H&P , NPO status , Patient's Chart, lab work & pertinent test results  Airway Mallampati: I TM Distance: >3 FB Neck ROM: full    Dental  (+) Poor Dentition and Missing   Pulmonary shortness of breath, COPD  Dec R  Pulmonary exam normal + decreased breath sounds      Cardiovascular + dysrhythmias Supra Ventricular Tachycardia Rhythm:Regular Rate:Normal     Neuro/Psych  Neuromuscular disease    GI/Hepatic Neg liver ROS, GERD-  Medicated and Controlled,  Endo/Other    Renal/GU negative Renal ROS     Musculoskeletal  (+) Fibromyalgia -  Abdominal   Peds  Hematology negative hematology ROS (+)   Anesthesia Other Findings Tick bite lower back. Dr Lovell Sheehan informed. 4 days ago.  Reproductive/Obstetrics                          Anesthesia Physical Anesthesia Plan  ASA: II  Anesthesia Plan: General   Post-op Pain Management:    Induction: Intravenous  Airway Management Planned: Oral ETT  Additional Equipment:   Intra-op Plan:   Post-operative Plan: Extubation in OR  Informed Consent: I have reviewed the patients History and Physical, chart, labs and discussed the procedure including the risks, benefits and alternatives for the proposed anesthesia with the patient or authorized representative who has indicated his/her understanding and acceptance.   Dental advisory given  Plan Discussed with: CRNA, Anesthesiologist and Surgeon  Anesthesia Plan Comments:         Anesthesia Quick Evaluation

## 2012-09-14 NOTE — Preoperative (Signed)
Beta Blockers   Reason not to administer Beta Blockers:Not Applicable 

## 2012-09-14 NOTE — Plan of Care (Signed)
Problem: Consults Goal: Diagnosis - Spinal Surgery Outcome: Completed/Met Date Met:  09/14/12 Lumbar Laminectomy (Complex)     

## 2012-09-15 ENCOUNTER — Encounter (HOSPITAL_COMMUNITY): Payer: Self-pay | Admitting: Neurosurgery

## 2012-10-16 NOTE — Discharge Summary (Signed)
  Physician Discharge Summary  Patient ID: Susan Potts MRN: [id number] DOB/AGE: 1960/08/26 52 y.o.  Admit date: 09/14/2012 Discharge date: 10/16/2012 09/14/2012  Admission Diagnoses: Left L3-4 far lateral herniated disc, lumbago, lumbar radiculopathy  Discharge Diagnoses: The same Active Problems:   * No active hospital problems. *   Discharged Condition: good  Hospital Course: I performed a left L3-4 far lateral discectomy on the patient on 09/14/2012. The surgery went well. On the day of surgery the patient requested discharge to home. She was given oral and written discharge instructions. All her questions were answered.  Consults: None Significant Diagnostic Studies: None Treatments: Left L3-4 far lateral discectomy using microdissection Discharge Exam: Blood pressure 96/59, pulse 86, temperature 98.1 F (36.7 C), temperature source Oral, resp. rate 18, SpO2 97.00%. Patient is alert and pleasant. Her strength is normal. Her dressing is clean and dry.  Disposition: Home     Medication List    STOP taking these medications       FLEXERIL 10 MG tablet  Generic drug:  cyclobenzaprine     HYDROcodone-acetaminophen 10-325 MG per tablet  Commonly known as:  NORCO      TAKE these medications       BC FAST PAIN RELIEF PO  Take 1 packet by mouth 2 (two) times daily as needed (migraines).     diazepam 5 MG tablet  Commonly known as:  VALIUM  Take 5 mg by mouth every 6 (six) hours as needed for anxiety or sleep.     diazepam 5 MG tablet  Commonly known as:  VALIUM  Take 1 tablet (5 mg total) by mouth every 6 (six) hours as needed for anxiety.     diltiazem 120 MG tablet  Commonly known as:  CARDIZEM  Take 120 mg by mouth daily.     oxyCODONE-acetaminophen 10-325 MG per tablet  Commonly known as:  PERCOCET  Take 1 tablet by mouth every 4 (four) hours as needed for pain.     promethazine 25 MG tablet  Commonly known as:  PHENERGAN  Take 25 mg by mouth every 6  (six) hours as needed for nausea.         SignedTressie Stalker D 10/16/2012, 10:08 AM

## 2013-03-09 ENCOUNTER — Other Ambulatory Visit: Payer: Self-pay | Admitting: Neurosurgery

## 2013-03-09 DIAGNOSIS — M5416 Radiculopathy, lumbar region: Secondary | ICD-10-CM

## 2013-03-16 ENCOUNTER — Telehealth: Payer: Self-pay | Admitting: Family Medicine

## 2013-03-16 NOTE — Telephone Encounter (Signed)
[employer] [facility]/Knee Brace/  Have Dr. Modesto Charon to sign for these . She is in need of these

## 2013-03-18 NOTE — Telephone Encounter (Signed)
Whoever Rx these for her will need to sign for it. Did I or anyone see her for this in the last year or two? I do not see any note from [facility]? Tihanna Goodson P. Modesto Charon, M.D.

## 2013-03-19 ENCOUNTER — Telehealth: Payer: Self-pay | Admitting: Family Medicine

## 2013-03-19 ENCOUNTER — Ambulatory Visit
Admission: RE | Admit: 2013-03-19 | Discharge: 2013-03-19 | Disposition: A | Discharge status: Home / Self Care | Payer: PRIVATE HEALTH INSURANCE | Source: Ambulatory Visit | Attending: Neurosurgery | Admitting: Neurosurgery

## 2013-03-19 VITALS — BP 81/50 | HR 67

## 2013-03-19 DIAGNOSIS — M5416 Radiculopathy, lumbar region: Secondary | ICD-10-CM

## 2013-03-19 MED ORDER — IOHEXOL 180 MG/ML  SOLN
15.0000 mL | Freq: Once | INTRAMUSCULAR | Status: AC | PRN
  Administered 2013-03-19: 15 mL via INTRATHECAL

## 2013-03-19 MED ORDER — ONDANSETRON HCL 4 MG/2ML IJ SOLN
4.0000 mg | Freq: Once | INTRAMUSCULAR | Status: AC
  Administered 2013-03-19: 4 mg via INTRAMUSCULAR

## 2013-03-19 MED ORDER — HYDROMORPHONE HCL PF 2 MG/ML IJ SOLN
1.5000 mg | Freq: Once | INTRAMUSCULAR | Status: AC
  Administered 2013-03-19: 1.5 mg via INTRAMUSCULAR

## 2013-03-19 MED ORDER — DIAZEPAM 5 MG PO TABS
10.0000 mg | ORAL_TABLET | Freq: Once | ORAL | Status: AC
  Administered 2013-03-19: 10 mg via ORAL

## 2013-03-19 NOTE — Telephone Encounter (Signed)
Spoke with pt and she is aware to contac Dr Tressie Stalker about her needs for these items . ;pt verbalized understanding.

## 2013-03-19 NOTE — Progress Notes (Signed)
Patient states she has been off Phenergan for the past two days.

## 2013-03-19 NOTE — Telephone Encounter (Signed)
Left message for pt to return call .She reports that pt is going to see Dr Lovell Sheehan @[place] 

## 2013-04-10 ENCOUNTER — Encounter: Payer: Self-pay | Admitting: General Practice

## 2013-04-12 ENCOUNTER — Other Ambulatory Visit: Payer: Self-pay

## 2013-04-30 ENCOUNTER — Other Ambulatory Visit: Payer: Self-pay | Admitting: General Practice

## 2013-05-07 ENCOUNTER — Other Ambulatory Visit: Payer: Self-pay | Admitting: Cardiology

## 2013-06-11 ENCOUNTER — Other Ambulatory Visit: Payer: Self-pay | Admitting: Adult Health

## 2013-06-15 ENCOUNTER — Encounter (HOSPITAL_COMMUNITY): Payer: Self-pay | Admitting: Emergency Medicine

## 2013-06-15 ENCOUNTER — Emergency Department (HOSPITAL_COMMUNITY)
Admission: EM | Admit: 2013-06-15 | Discharge: 2013-06-15 | Disposition: A | Discharge status: Home / Self Care | Payer: Medicare Other | Attending: Emergency Medicine | Admitting: Emergency Medicine

## 2013-06-15 ENCOUNTER — Emergency Department (HOSPITAL_COMMUNITY): Payer: Medicare Other

## 2013-06-15 DIAGNOSIS — Z8719 Personal history of other diseases of the digestive system: Secondary | ICD-10-CM | POA: Insufficient documentation

## 2013-06-15 DIAGNOSIS — J209 Acute bronchitis, unspecified: Secondary | ICD-10-CM | POA: Insufficient documentation

## 2013-06-15 DIAGNOSIS — J441 Chronic obstructive pulmonary disease with (acute) exacerbation: Secondary | ICD-10-CM | POA: Insufficient documentation

## 2013-06-15 DIAGNOSIS — G589 Mononeuropathy, unspecified: Secondary | ICD-10-CM | POA: Insufficient documentation

## 2013-06-15 DIAGNOSIS — M549 Dorsalgia, unspecified: Secondary | ICD-10-CM | POA: Insufficient documentation

## 2013-06-15 DIAGNOSIS — IMO0001 Reserved for inherently not codable concepts without codable children: Secondary | ICD-10-CM | POA: Insufficient documentation

## 2013-06-15 DIAGNOSIS — F17200 Nicotine dependence, unspecified, uncomplicated: Secondary | ICD-10-CM | POA: Insufficient documentation

## 2013-06-15 DIAGNOSIS — E785 Hyperlipidemia, unspecified: Secondary | ICD-10-CM | POA: Insufficient documentation

## 2013-06-15 DIAGNOSIS — J40 Bronchitis, not specified as acute or chronic: Secondary | ICD-10-CM

## 2013-06-15 DIAGNOSIS — J069 Acute upper respiratory infection, unspecified: Secondary | ICD-10-CM | POA: Insufficient documentation

## 2013-06-15 DIAGNOSIS — M2550 Pain in unspecified joint: Secondary | ICD-10-CM | POA: Insufficient documentation

## 2013-06-15 DIAGNOSIS — I471 Supraventricular tachycardia, unspecified: Secondary | ICD-10-CM | POA: Insufficient documentation

## 2013-06-15 DIAGNOSIS — G8929 Other chronic pain: Secondary | ICD-10-CM | POA: Insufficient documentation

## 2013-06-15 DIAGNOSIS — Z981 Arthrodesis status: Secondary | ICD-10-CM | POA: Insufficient documentation

## 2013-06-15 DIAGNOSIS — Z79899 Other long term (current) drug therapy: Secondary | ICD-10-CM | POA: Insufficient documentation

## 2013-06-15 DIAGNOSIS — M542 Cervicalgia: Secondary | ICD-10-CM | POA: Insufficient documentation

## 2013-06-15 DIAGNOSIS — H9209 Otalgia, unspecified ear: Secondary | ICD-10-CM | POA: Insufficient documentation

## 2013-06-15 MED ORDER — ALBUTEROL SULFATE HFA 108 (90 BASE) MCG/ACT IN AERS
2.0000 | INHALATION_SPRAY | RESPIRATORY_TRACT | Status: DC | PRN
  Administered 2013-06-15: 2 via RESPIRATORY_TRACT
  Filled 2013-06-15: qty 6.7

## 2013-06-15 MED ORDER — CLARITHROMYCIN 250 MG PO TABS: 250.0000 mg | ORAL_TABLET | Freq: Two times a day (BID) | ORAL | Status: DC

## 2013-06-15 MED ORDER — HYDROCOD POLST-CHLORPHEN POLST 10-8 MG/5ML PO LQCR: 5.0000 mL | Freq: Two times a day (BID) | ORAL | Status: DC

## 2013-06-15 MED ORDER — PREDNISONE 10 MG PO TABS: ORAL_TABLET | ORAL | Status: DC

## 2013-06-15 NOTE — Discharge Instructions (Signed)
Your vital signs are well within normal limits. Your chest x-ray suggests bronchitis. Please increase fluids. Please wash hands frequently. Please stop smoking. Please use Biaxin and albuterol and prednisone taper as prescribed. May use Tussionex cough medication if needed every 12 hours or 2 times daily for cough. This medication may cause drowsiness, please use with caution. Please see Dr. Christell ConstantMoore for additional evaluation if not improving. Upper Respiratory Infection, Adult An upper respiratory infection (URI) is also sometimes known as the common cold. The upper respiratory tract includes the nose, sinuses, throat, trachea, and bronchi. Bronchi are the airways leading to the lungs. Most people improve within 1 week, but symptoms can last up to 2 weeks. A residual cough may last even longer.  CAUSES Many different viruses can infect the tissues lining the upper respiratory tract. The tissues become irritated and inflamed and often become very moist. Mucus production is also common. A cold is contagious. You can easily spread the virus to others by oral contact. This includes kissing, sharing a glass, coughing, or sneezing. Touching your mouth or nose and then touching a surface, which is then touched by another person, can also spread the virus. SYMPTOMS  Symptoms typically develop 1 to 3 days after you come in contact with a cold virus. Symptoms vary from person to person. They may include:  Runny nose.  Sneezing.  Nasal congestion.  Sinus irritation.  Sore throat.  Loss of voice (laryngitis).  Cough.  Fatigue.  Muscle aches.  Loss of appetite.  Headache.  Low-grade fever. DIAGNOSIS  You might diagnose your own cold based on familiar symptoms, since most people get a cold 2 to 3 times a year. Your caregiver can confirm this based on your exam. Most importantly, your caregiver can check that your symptoms are not due to another disease such as strep throat, sinusitis, pneumonia,  asthma, or epiglottitis. Blood tests, throat tests, and X-rays are not necessary to diagnose a common cold, but they may sometimes be helpful in excluding other more serious diseases. Your caregiver will decide if any further tests are required. RISKS AND COMPLICATIONS  You may be at risk for a more severe case of the common cold if you smoke cigarettes, have chronic heart disease (such as heart failure) or lung disease (such as asthma), or if you have a weakened immune system. The very young and very old are also at risk for more serious infections. Bacterial sinusitis, middle ear infections, and bacterial pneumonia can complicate the common cold. The common cold can worsen asthma and chronic obstructive pulmonary disease (COPD). Sometimes, these complications can require emergency medical care and may be life-threatening. PREVENTION  The best way to protect against getting a cold is to practice good hygiene. Avoid oral or hand contact with people with cold symptoms. Wash your hands often if contact occurs. There is no clear evidence that vitamin C, vitamin E, echinacea, or exercise reduces the chance of developing a cold. However, it is always recommended to get plenty of rest and practice good nutrition. TREATMENT  Treatment is directed at relieving symptoms. There is no cure. Antibiotics are not effective, because the infection is caused by a virus, not by bacteria. Treatment may include:  Increased fluid intake. Sports drinks offer valuable electrolytes, sugars, and fluids.  Breathing heated mist or steam (vaporizer or shower).  Eating chicken soup or other clear broths, and maintaining good nutrition.  Getting plenty of rest.  Using gargles or lozenges for comfort.  Controlling fevers with ibuprofen  or acetaminophen as directed by your caregiver.  Increasing usage of your inhaler if you have asthma. Zinc gel and zinc lozenges, taken in the first 24 hours of the common cold, can shorten the  duration and lessen the severity of symptoms. Pain medicines may help with fever, muscle aches, and throat pain. A variety of non-prescription medicines are available to treat congestion and runny nose. Your caregiver can make recommendations and may suggest nasal or lung inhalers for other symptoms.  HOME CARE INSTRUCTIONS   Only take over-the-counter or prescription medicines for pain, discomfort, or fever as directed by your caregiver.  Use a warm mist humidifier or inhale steam from a shower to increase air moisture. This may keep secretions moist and make it easier to breathe.  Drink enough water and fluids to keep your urine clear or pale yellow.  Rest as needed.  Return to work when your temperature has returned to normal or as your caregiver advises. You may need to stay home longer to avoid infecting others. You can also use a face mask and careful hand washing to prevent spread of the virus. SEEK MEDICAL CARE IF:   After the first few days, you feel you are getting worse rather than better.  You need your caregiver's advice about medicines to control symptoms.  You develop chills, worsening shortness of breath, or brown or red sputum. These may be signs of pneumonia.  You develop yellow or brown nasal discharge or pain in the face, especially when you bend forward. These may be signs of sinusitis.  You develop a fever, swollen neck glands, pain with swallowing, or white areas in the back of your throat. These may be signs of strep throat. SEEK IMMEDIATE MEDICAL CARE IF:   You have a fever.  You develop severe or persistent headache, ear pain, sinus pain, or chest pain.  You develop wheezing, a prolonged cough, cough up blood, or have a change in your usual mucus (if you have chronic lung disease).  You develop sore muscles or a stiff neck. Document Released: 11/17/2000 Document Revised: 08/16/2011 Document Reviewed: 09/25/2010 [facility] Patient Information 2014 [place],  [place].

## 2013-06-15 NOTE — ED Provider Notes (Signed)
Medical screening examination/treatment/procedure(s) were performed by non-physician practitioner and as supervising physician I was immediately available for consultation/collaboration.  EKG Interpretation   None         Jed Kutch L Shanah Guimaraes, MD 06/15/13 1517 

## 2013-06-15 NOTE — ED Notes (Signed)
Pt c/o sorethroat, cough, right ear pain, fever x 3 days. States cough is productive but unknown color.

## 2013-06-15 NOTE — ED Notes (Signed)
Pt awaiting INH from [facility] for discharge.

## 2013-06-15 NOTE — ED Provider Notes (Signed)
CSN: [id number]     Arrival date & time 06/15/13  1036 History   First MD Initiated Contact with Patient 06/15/13 1105     Chief Complaint  Patient presents with  . Sore Throat  . Cough  . Otalgia   (Consider location/radiation/quality/duration/timing/severity/associated sxs/prior Treatment) Patient is a 53 y.o. female presenting with cough and ear pain. The history is provided by the patient.  Cough Cough characteristics:  Productive Sputum characteristics:  Clear Severity:  Moderate Onset quality:  Gradual Duration:  3 days Timing:  Intermittent Progression:  Worsening Chronicity:  New Smoker: yes   Context: sick contacts   Relieved by:  Nothing Ineffective treatments:  None tried Associated symptoms: chills, ear pain, myalgias, rhinorrhea, sinus congestion and sore throat   Associated symptoms: no chest pain, no eye discharge, no shortness of breath and no wheezing   Risk factors: no recent travel   Otalgia Associated symptoms: cough, neck pain, rhinorrhea and sore throat   Associated symptoms: no abdominal pain     Past Medical History  Diagnosis Date  . Fibromyalgia   . Degenerative disc disease     LS spine discectomy and fusion; parasternal rod  . Carpal tunnel syndrome   . Hyperlipidemia   . Ulnar neuritis   . Tobacco abuse     40 pack years; one pack per day  . Gastroesophageal reflux disease   . COPD (chronic obstructive pulmonary disease)   . Neuropathy   . Chronic neck pain   . Chronic back pain   . Shortness of breath   . PSVT (paroxysmal supraventricular tachycardia)     normal coronary angiography in 2006; records from EMS documented supraventricular tachycardia at approximately 195 bpm on 05/10/10   Past Surgical History  Procedure Laterality Date  . Lumbar disc surgery  07/2010  . Ganglion cyst excision  2009    Right wrist  . Cesarean section  1995  . Tubal ligation  1995  . Carpal tunnel release  2009    Right  . Spinal fixation surgery w/  implant      L4, L5  . Back surgery    . Lumbar laminectomy/decompression microdiscectomy Left 09/14/2012    Procedure: LUMBAR LAMINECTOMY/DECOMPRESSION MICRODISCECTOMY 1 LEVEL;  Surgeon: Cristi Loron, MD;  Location: [facility];  Service: Neurosurgery;  Laterality: Left;  LEFT Lumbar three-four diskectomy   Family History  Problem Relation Age of Onset  . Arthritis Other   . Stroke Other   . Cancer Mother   . Diabetes Mother   . Hypertension Mother   . Thyroid disease Mother   . Diabetes Brother   . Hypertension Brother    History  Substance Use Topics  . Smoking status: Current Every Day Smoker -- 1.00 packs/day for 43 years    Types: Cigarettes  . Smokeless tobacco: Never Used  . Alcohol Use: No   OB History   Grav Para Term Preterm Abortions TAB SAB Ect Mult Living   2 2 2       2      Review of Systems  Constitutional: Positive for chills. Negative for activity change.       All ROS Neg except as noted in HPI  HENT: Positive for ear pain, rhinorrhea and sore throat. Negative for nosebleeds.   Eyes: Negative for photophobia and discharge.  Respiratory: Positive for cough. Negative for shortness of breath and wheezing.   Cardiovascular: Negative for chest pain and palpitations.  Gastrointestinal: Negative for abdominal pain and  blood in stool.  Genitourinary: Negative for dysuria, frequency and hematuria.  Musculoskeletal: Positive for arthralgias, back pain, myalgias and neck pain.  Skin: Negative.   Neurological: Negative for dizziness, seizures and speech difficulty.  Psychiatric/Behavioral: Negative for hallucinations and confusion.    Allergies  Sulfonamide derivatives  Home Medications   Current Outpatient Rx  Name  Route  Sig  Dispense  Refill  . diltiazem (CARDIZEM) 120 MG tablet   Oral   Take 120 mg by mouth daily.         . DULoxetine (CYMBALTA) 30 MG capsule   Oral   Take 1 capsule by mouth 2 (two) times daily.         Marland Kitchen. gabapentin  (NEURONTIN) 300 MG capsule   Oral   Take 1 capsule by mouth 3 (three) times daily.         Marland Kitchen. oxyCODONE-acetaminophen (PERCOCET) 10-325 MG per tablet   Oral   Take 1 tablet by mouth every 6 (six) hours as needed for pain.          BP 112/79  Pulse 84  Temp(Src) 97.6 F (36.4 C) (Oral)  Resp 16  Wt 185 lb (83.915 kg)  SpO2 99% Physical Exam  Nursing note and vitals reviewed. Constitutional: She is oriented to person, place, and time. She appears well-developed and well-nourished.  Non-toxic appearance.  HENT:  Head: Normocephalic.  Right Ear: Tympanic membrane and external ear normal.  Left Ear: Tympanic membrane and external ear normal.  Nasal congestion.  Eyes: EOM and lids are normal. Pupils are equal, round, and reactive to light.  Neck: Normal range of motion. Neck supple. Carotid bruit is not present.  Cardiovascular: Normal rate, regular rhythm, normal heart sounds, intact distal pulses and normal pulses.   Pulmonary/Chest: Breath sounds normal. No respiratory distress.  Bilateral rhonchi present. Few scattered wheezes present.  Abdominal: Soft. Bowel sounds are normal. There is no tenderness. There is no guarding.  Musculoskeletal: Normal range of motion.  Lymphadenopathy:       Head (right side): No submandibular adenopathy present.       Head (left side): No submandibular adenopathy present.    She has no cervical adenopathy.  Neurological: She is alert and oriented to person, place, and time. She has normal strength. No cranial nerve deficit or sensory deficit.  Skin: Skin is warm and dry.  Psychiatric: She has a normal mood and affect. Her speech is normal.    ED Course  Procedures (including critical care time) Labs Review Labs Reviewed - No data to display Imaging Review Dg Chest 2 View  06/15/2013   CLINICAL DATA:  Sore throat and cough with otalgia.  EXAM: CHEST  2 VIEW  COMPARISON:  09/12/2012 and 01/2011  FINDINGS: Lungs are adequately inflated without  consolidation or effusion. There is a small calcified granuloma over the right lower lobe unchanged. Cardiomediastinal silhouette and remainder of the exam is unchanged.  IMPRESSION: No active cardiopulmonary disease.   Electronically Signed   By: Elberta Fortisaniel  Boyle M.D.   On: 06/15/2013 11:13    EKG Interpretation   None       MDM  No diagnosis found. *I have reviewed nursing notes, vital signs, and all appropriate lab and imaging results for this patient.** Vital signs are within normal limits. The pulse oximetry is 99%. Within normal limits by my interpretation. Thurston Poundsrey is negative for any acute problem. The x-ray of the chest shows no active cardiopulmonary disease. Patient will be treated for bronchitis.  Prescription for Tussionex, Biaxin, and prednisone given to the patient. Patient also given an albuterol inhaler to use. Patient is to follow with her primary physician if not improving.   Kathie Dike, PA-C 06/15/13 1231

## 2013-08-09 ENCOUNTER — Telehealth: Payer: Self-pay

## 2013-08-09 NOTE — Telephone Encounter (Signed)
Received fax refill request  Rx # [id number] Medication:  Diltiazem 120 mg tab Qty 30 Sig:  Take 1 tablet by mouth daily Physician:  Lyman BishopLawrence

## 2013-08-09 NOTE — Telephone Encounter (Signed)
We do not follow pt in cardiology,[facility] to [facility]

## 2013-11-01 ENCOUNTER — Telehealth: Payer: Self-pay | Admitting: Adult Health

## 2013-11-01 NOTE — Telephone Encounter (Signed)
Received fax refill request  Rx # [id number] Medication:  Diltiazem 120 mg tablet Qty 30 Sig:  Take one tablet by mouth daily Physician:  Lyman Bishop

## 2013-11-01 NOTE — Telephone Encounter (Signed)
Refilled denied. Pt is not being followed by a cardiologist at this time

## 2014-04-08 ENCOUNTER — Encounter (HOSPITAL_COMMUNITY): Payer: Self-pay | Admitting: Emergency Medicine

## 2014-04-11 IMAGING — CT CT L SPINE W/ CM
4 of 12 series · 14 of 35 positions shown, 15 images · non-contrast
Comparison: MRI of the lumbar spine 08/31/2012.

CLINICAL DATA: Increasing low back pain with left greater than
right lower extremity pain. Status post lumbar fusion at L5-S1.

EXAM:
LUMBAR MYELOGRAM
TECHNIQUE: Contiguous axial images were obtained through the lumbar spine
without infusion. Coronal, sagittal, and disc space reconstructions
were obtained of the axial image sets.

[Series 2: l spine bone · axial · 0.27mm/px · z∈[-409,-159]mm · 3 of 101 slices shown, 4 images]
[im 1/101  soft-tissue]
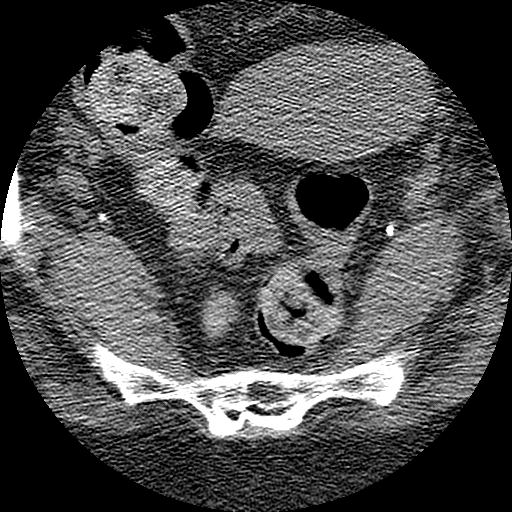
[im 1/101  bone]
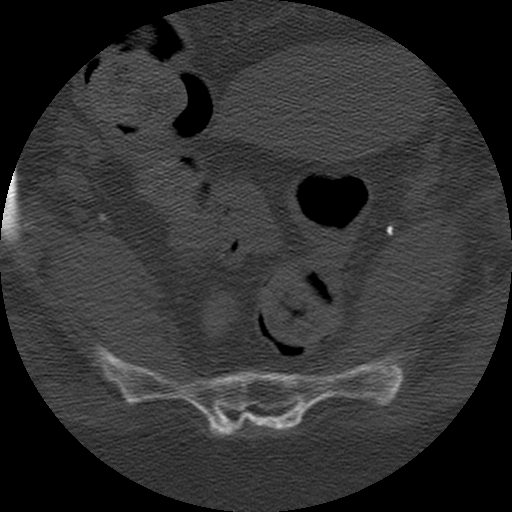
[im 51/101  bone]
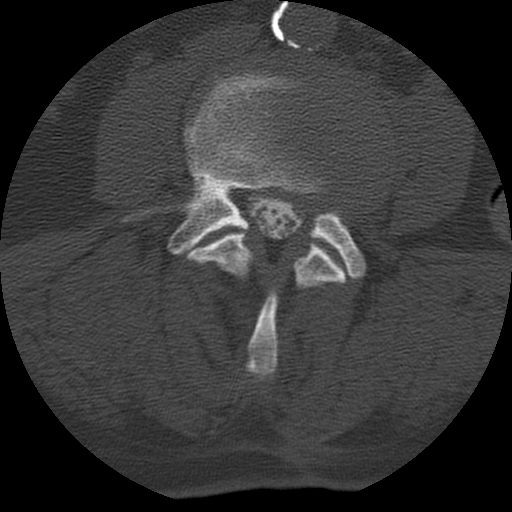
[im 101/101  bone]
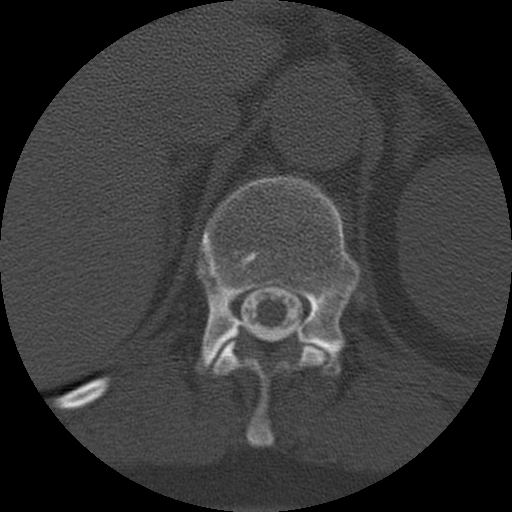

[Series 3: l spine soft · axial · 0.27mm/px · z∈[-326,-244]mm · 2 of 101 slices shown]
[im 34/101  soft-tissue]
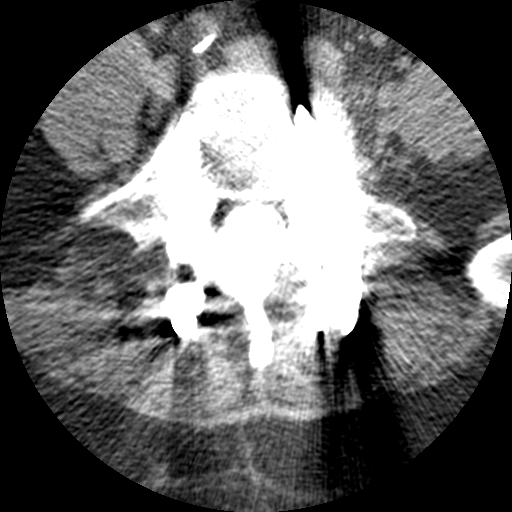
[im 67/101  soft-tissue]
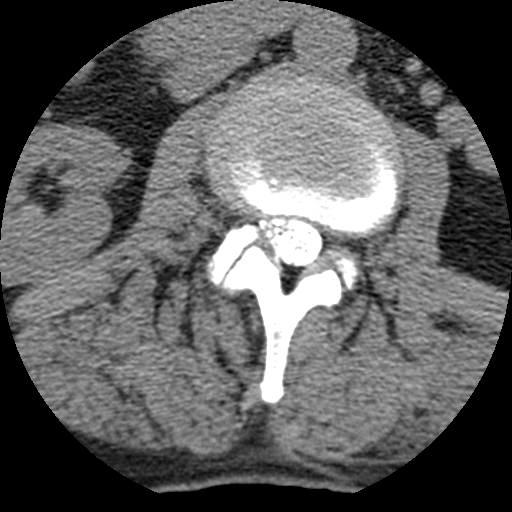

[Series 401: cor lower · coronal · 0.50mm/px · 3 of 66 slices shown]
[im 10/66  bone]
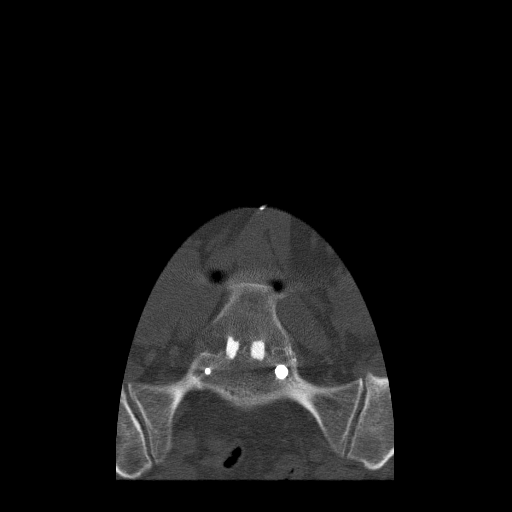
[im 25/66  bone]
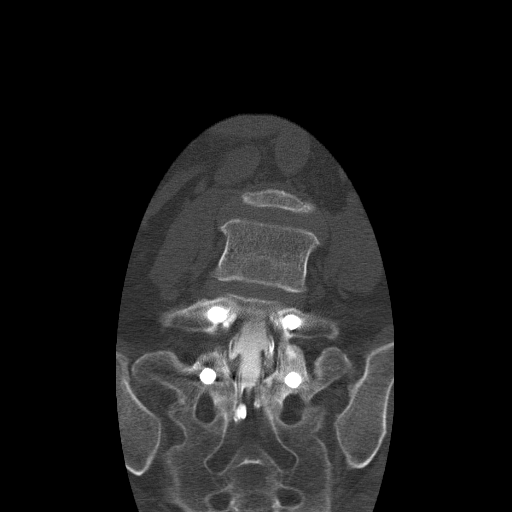
[im 40/66  bone]
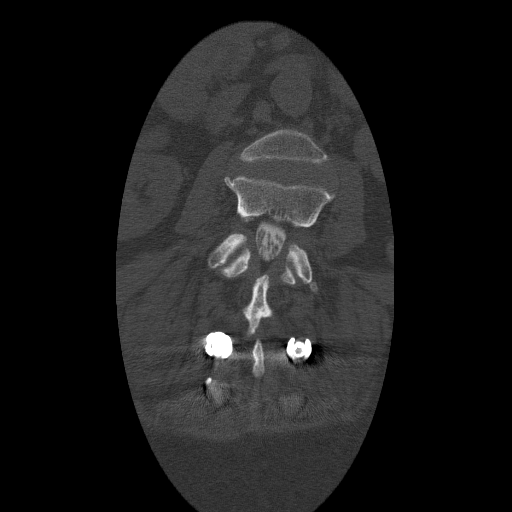

[Series 402: sag upper · sagittal · 0.50mm/px · 6 of 56 slices shown]
[im 7/56  soft-tissue]
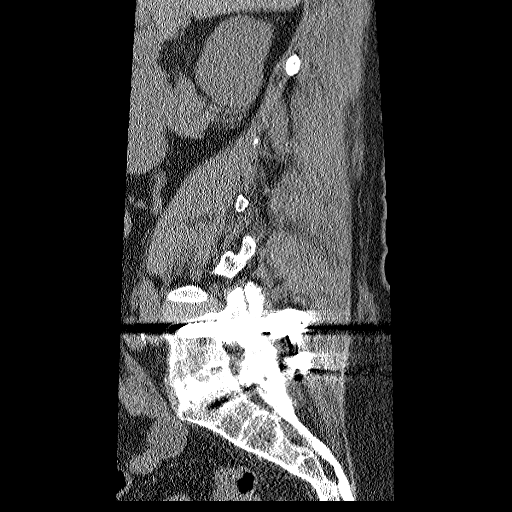
[im 10/56  bone]
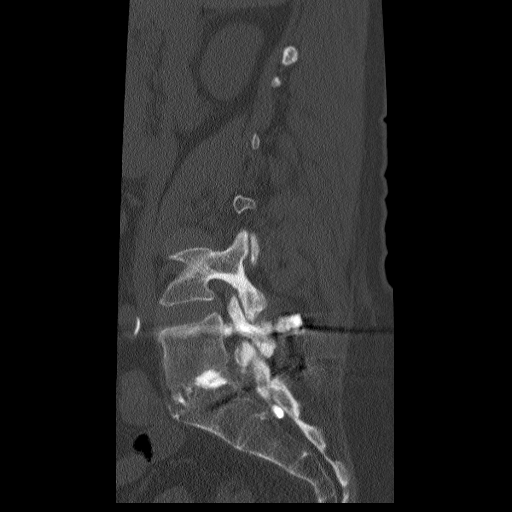
[im 19/56  bone]
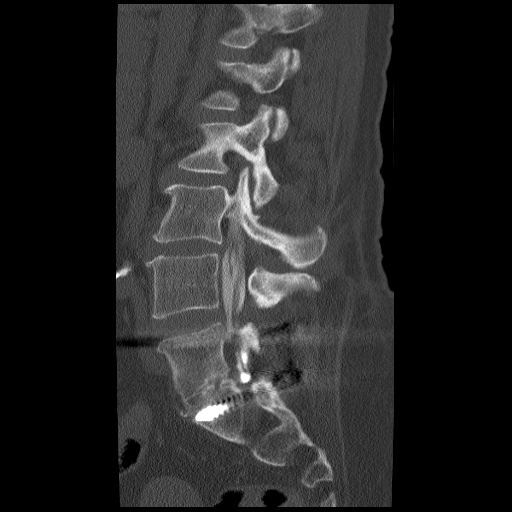
[im 28/56  bone]
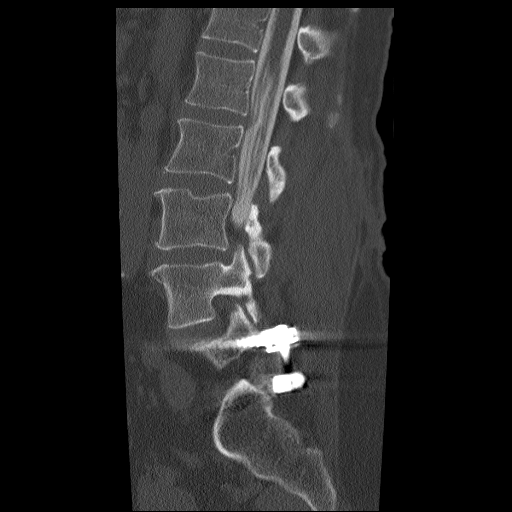
[im 37/56  bone]
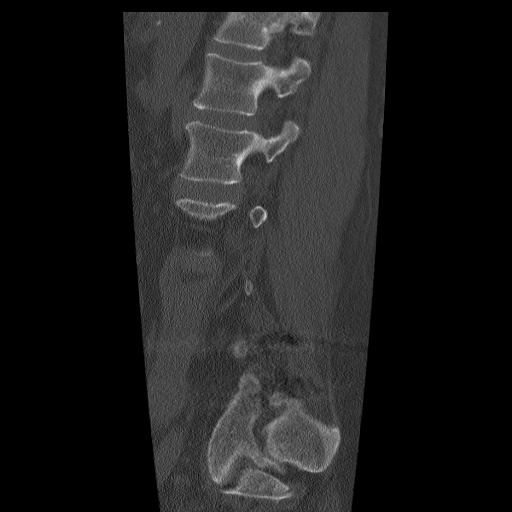
[im 46/56  bone]
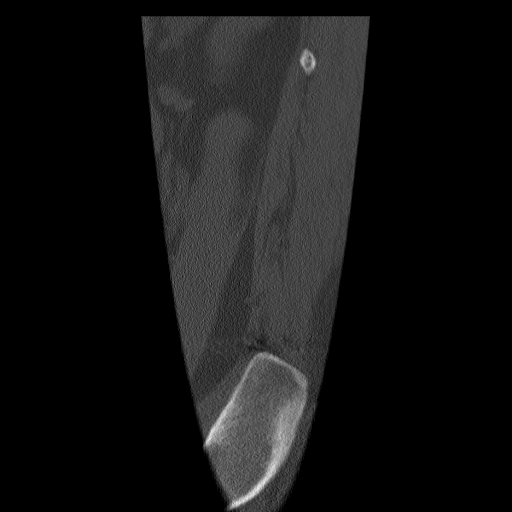

[14 of 35 positions shown; findings below may reference images not displayed]

FLUOROSCOPY TIME:  44 seconds

PROCEDURE:
After thorough discussion of risks and benefits of the procedure
including bleeding, infection, injury to nerves, blood vessels,
adjacent structures as well as headache and CSF leak, written and
oral informed consent was obtained. Consent was obtained by Dr.
Tootie Arun. Time out form was completed.

Patient was positioned prone on the fluoroscopy table. Local
anesthesia was provided with 1% lidocaine without epinephrine after
prepped and draped in the usual sterile fashion. Puncture was
performed at L2-3 using a 3 1/2 inch 22-gauge spinal needle via
right paramedian approach. Using a single pass through the dura, the
needle was placed within the thecal sac, with return of clear CSF.
15 mL of Lmnipaque-NOY was injected into the thecal sac, with normal
opacification of the nerve roots and cauda equina consistent with
free flow within the subarachnoid space.

I personally performed the lumbar puncture and administered the
intrathecal contrast. I also personally supervised acquisition of
the myelogram images.
FINDINGS: LUMBAR MYELOGRAM FINDINGS:

The patient is status post PLIF at L5-S1. Mild leftward curvature of
the lumbar spine is centered at L2-3. Mild left lateral recess
narrowing at L2-3 persists. There is a left lateral recess filling
defect L3-4 which may represent scar tissue or residual/recurrent
disc. The lower lumbar nerve roots are ectatic, as before.

The lateral view demonstrates new retrolisthesis at L3-4. The upper
it images demonstrate additional retrolisthesis at L2-3 is well. The
L3-4 retrolisthesis is slightly reduced in flexion. The L2-3
retrolisthesis is not significantly changed with flexion and
extension.

CT LUMBAR MYELOGRAM FINDINGS:

The lumbar spine is convex to the left at L2-3. Vertebral body
heights are maintained. The CT images demonstrate slight
retrolisthesis at L2-3 measuring 3 mm. This is more pronounced on
the plain films. L2-3 retrolisthesis is not evident on CT.

The patient is status post PLIF at L5-S1. The fusion is which or
anteriorly and posteriorly. The hardware is intact.

T12-L1: Negative.

L1-2:  Mild disc bulging is present without significant stenosis.

L2-3: Mild broad-based disk bulging is present. A far left lateral
disc protrusion extends into the left neural foramen with mild left
foraminal stenosis.

L3-4: A left lateral recess protrusion persists with mild narrowing
of the left lateral recess. There is soft tissue within the left
foramen as well. A portion of this may represent postoperative
scarring. Mild right foraminal stenosis is stable.

L4-5: A leftward disc herniation is present. Mild left lateral
recess and foraminal narrowing is present.

L5-S1: The patient is fused anteriorly and posteriorly. No
significant stenosis is present.
IMPRESSION: LUMBAR MYELOGRAM IMPRESSION:

1. New grade 1 retrolisthesis at L3-4 is partially reduced with
flexion.
2. Left lateral recess filling defect at L3-4. This may represent
scar tissue or residual/ recurrent disc material.
3. Persistent mild left lateral recess narrowing at L2-3.
4. Retrolisthesis at L2-3 is present upon standing, but not seen on
the prone images.

CT LUMBAR MYELOGRAM IMPRESSION:

1. Stable far left lateral disc protrusion L2-3 with mild left
foraminal stenosis and lateral recess narrowing.
2. Left lateral recess disc protrusion persists at L3-4 with mild
left lateral recess narrowing.
3. Soft tissue within the left foramen at L[DATE] represent
residual disc material versus scar tissue.
4. Stable mild right foraminal narrowing at L3-4.
5. Status post anterior and posterior fusion without residual or
recurrent stenosis at L5-S1.

## 2014-08-19 DIAGNOSIS — F17200 Nicotine dependence, unspecified, uncomplicated: Secondary | ICD-10-CM | POA: Diagnosis not present

## 2014-08-19 DIAGNOSIS — S6992XA Unspecified injury of left wrist, hand and finger(s), initial encounter: Secondary | ICD-10-CM | POA: Diagnosis not present

## 2014-08-19 DIAGNOSIS — M25562 Pain in left knee: Secondary | ICD-10-CM | POA: Diagnosis not present

## 2014-08-19 DIAGNOSIS — S8992XA Unspecified injury of left lower leg, initial encounter: Secondary | ICD-10-CM | POA: Diagnosis not present

## 2014-08-19 DIAGNOSIS — X58XXXA Exposure to other specified factors, initial encounter: Secondary | ICD-10-CM | POA: Diagnosis not present

## 2014-08-19 DIAGNOSIS — M25532 Pain in left wrist: Secondary | ICD-10-CM | POA: Diagnosis not present

## 2014-08-19 DIAGNOSIS — S60212A Contusion of left wrist, initial encounter: Secondary | ICD-10-CM | POA: Diagnosis not present

## 2014-08-19 DIAGNOSIS — S8002XA Contusion of left knee, initial encounter: Secondary | ICD-10-CM | POA: Diagnosis not present

## 2014-08-27 DIAGNOSIS — S8002XA Contusion of left knee, initial encounter: Secondary | ICD-10-CM | POA: Diagnosis not present

## 2014-08-27 DIAGNOSIS — S63502A Unspecified sprain of left wrist, initial encounter: Secondary | ICD-10-CM | POA: Diagnosis not present

## 2014-10-16 DIAGNOSIS — M461 Sacroiliitis, not elsewhere classified: Secondary | ICD-10-CM | POA: Diagnosis not present

## 2014-11-05 DIAGNOSIS — M654 Radial styloid tenosynovitis [de Quervain]: Secondary | ICD-10-CM | POA: Diagnosis not present

## 2014-11-22 ENCOUNTER — Emergency Department (HOSPITAL_COMMUNITY)
Admission: EM | Admit: 2014-11-22 | Discharge: 2014-11-22 | Disposition: A | Discharge status: Home / Self Care | Payer: Medicare Other | Attending: Emergency Medicine | Admitting: Emergency Medicine

## 2014-11-22 ENCOUNTER — Emergency Department (HOSPITAL_COMMUNITY): Payer: Medicare Other

## 2014-11-22 ENCOUNTER — Encounter (HOSPITAL_COMMUNITY): Payer: Self-pay | Admitting: *Deleted

## 2014-11-22 DIAGNOSIS — Z8719 Personal history of other diseases of the digestive system: Secondary | ICD-10-CM | POA: Insufficient documentation

## 2014-11-22 DIAGNOSIS — R748 Abnormal levels of other serum enzymes: Secondary | ICD-10-CM

## 2014-11-22 DIAGNOSIS — I2510 Atherosclerotic heart disease of native coronary artery without angina pectoris: Secondary | ICD-10-CM | POA: Diagnosis not present

## 2014-11-22 DIAGNOSIS — R112 Nausea with vomiting, unspecified: Secondary | ICD-10-CM

## 2014-11-22 DIAGNOSIS — R630 Anorexia: Secondary | ICD-10-CM | POA: Diagnosis not present

## 2014-11-22 DIAGNOSIS — J40 Bronchitis, not specified as acute or chronic: Secondary | ICD-10-CM | POA: Diagnosis not present

## 2014-11-22 DIAGNOSIS — F17210 Nicotine dependence, cigarettes, uncomplicated: Secondary | ICD-10-CM | POA: Diagnosis not present

## 2014-11-22 DIAGNOSIS — R509 Fever, unspecified: Secondary | ICD-10-CM | POA: Diagnosis present

## 2014-11-22 DIAGNOSIS — Z79899 Other long term (current) drug therapy: Secondary | ICD-10-CM | POA: Diagnosis not present

## 2014-11-22 DIAGNOSIS — R1010 Upper abdominal pain, unspecified: Secondary | ICD-10-CM | POA: Diagnosis present

## 2014-11-22 DIAGNOSIS — Z8679 Personal history of other diseases of the circulatory system: Secondary | ICD-10-CM | POA: Insufficient documentation

## 2014-11-22 DIAGNOSIS — R05 Cough: Secondary | ICD-10-CM | POA: Diagnosis not present

## 2014-11-22 DIAGNOSIS — J441 Chronic obstructive pulmonary disease with (acute) exacerbation: Secondary | ICD-10-CM | POA: Insufficient documentation

## 2014-11-22 DIAGNOSIS — R42 Dizziness and giddiness: Secondary | ICD-10-CM | POA: Insufficient documentation

## 2014-11-22 DIAGNOSIS — Z720 Tobacco use: Secondary | ICD-10-CM | POA: Insufficient documentation

## 2014-11-22 DIAGNOSIS — R634 Abnormal weight loss: Secondary | ICD-10-CM | POA: Diagnosis present

## 2014-11-22 DIAGNOSIS — G629 Polyneuropathy, unspecified: Secondary | ICD-10-CM | POA: Insufficient documentation

## 2014-11-22 DIAGNOSIS — I700 Atherosclerosis of aorta: Secondary | ICD-10-CM | POA: Diagnosis not present

## 2014-11-22 DIAGNOSIS — M797 Fibromyalgia: Secondary | ICD-10-CM | POA: Insufficient documentation

## 2014-11-22 DIAGNOSIS — G8929 Other chronic pain: Secondary | ICD-10-CM | POA: Insufficient documentation

## 2014-11-22 DIAGNOSIS — Z8639 Personal history of other endocrine, nutritional and metabolic disease: Secondary | ICD-10-CM | POA: Insufficient documentation

## 2014-11-22 DIAGNOSIS — D7389 Other diseases of spleen: Secondary | ICD-10-CM | POA: Diagnosis not present

## 2014-11-22 DIAGNOSIS — J439 Emphysema, unspecified: Secondary | ICD-10-CM | POA: Diagnosis not present

## 2014-11-22 LAB — COMPREHENSIVE METABOLIC PANEL
ALK PHOS: 59 U/L (ref 38–126)
ALT: 9 U/L — AB (ref 14–54)
AST: 12 U/L — AB (ref 15–41)
Albumin: 3.8 g/dL (ref 3.5–5.0)
Anion gap: 8 (ref 5–15)
BILIRUBIN TOTAL: 0.5 mg/dL (ref 0.3–1.2)
BUN: 17 mg/dL (ref 6–20)
CHLORIDE: 107 mmol/L (ref 101–111)
CO2: 25 mmol/L (ref 22–32)
CREATININE: 0.45 mg/dL (ref 0.44–1.00)
Calcium: 8.7 mg/dL — ABNORMAL LOW (ref 8.9–10.3)
GFR calc Af Amer: >60 mL/min (ref >60)
Glucose, Bld: 120 mg/dL — ABNORMAL HIGH (ref 65–99)
POTASSIUM: 3.6 mmol/L (ref 3.5–5.1)
SODIUM: 140 mmol/L (ref 135–145)
Total Protein: 6.5 g/dL (ref 6.5–8.1)

## 2014-11-22 LAB — CBC WITH DIFFERENTIAL/PLATELET
BASOS ABS: 0 10*3/uL (ref 0.0–0.1)
BASOS PCT: 0 % (ref 0–1)
EOS ABS: 0.1 10*3/uL (ref 0.0–0.7)
EOS PCT: 1 % (ref 0–5)
HEMATOCRIT: 40.5 % (ref 36.0–46.0)
HEMOGLOBIN: 13.6 g/dL (ref 12.0–15.0)
Lymphocytes Relative: 27 % (ref 12–46)
Lymphs Abs: 1.7 10*3/uL (ref 0.7–4.0)
MCH: 32.3 pg (ref 26.0–34.0)
MCHC: 33.6 g/dL (ref 30.0–36.0)
MCV: 96.2 fL (ref 78.0–100.0)
MONO ABS: 0.4 10*3/uL (ref 0.1–1.0)
MONOS PCT: 7 % (ref 3–12)
Neutro Abs: 3.9 10*3/uL (ref 1.7–7.7)
Neutrophils Relative %: 65 % (ref 43–77)
Platelets: 166 10*3/uL (ref 150–400)
RBC: 4.21 MIL/uL (ref 3.87–5.11)
RDW: 13.2 % (ref 11.5–15.5)
WBC: 6.1 10*3/uL (ref 4.0–10.5)

## 2014-11-22 LAB — LIPASE, BLOOD: LIPASE: 136 U/L — AB (ref 22–51)

## 2014-11-22 MED ORDER — DOXYCYCLINE HYCLATE 100 MG PO CAPS: 100.0000 mg | ORAL_CAPSULE | Freq: Two times a day (BID) | ORAL | Status: DC

## 2014-11-22 MED ORDER — ONDANSETRON HCL 4 MG/2ML IJ SOLN
4.0000 mg | Freq: Once | INTRAMUSCULAR | Status: AC
  Administered 2014-11-22: 4 mg via INTRAVENOUS
  Filled 2014-11-22: qty 2

## 2014-11-22 MED ORDER — IOHEXOL 300 MG/ML  SOLN
100.0000 mL | Freq: Once | INTRAMUSCULAR | Status: AC | PRN
  Administered 2014-11-22: 100 mL via INTRAVENOUS

## 2014-11-22 MED ORDER — PROMETHAZINE HCL 25 MG PO TABS: 25.0000 mg | ORAL_TABLET | Freq: Four times a day (QID) | ORAL | Status: DC | PRN

## 2014-11-22 MED ORDER — IOHEXOL 300 MG/ML  SOLN
50.0000 mL | Freq: Once | INTRAMUSCULAR | Status: AC | PRN
  Administered 2014-11-22: 50 mL via ORAL

## 2014-11-22 NOTE — ED Notes (Signed)
Pt given discharge instructions - ambulated off unit with grandchildren - denied any questions -

## 2014-11-22 NOTE — Discharge Instructions (Signed)
Nausea and Vomiting °Nausea is a sick feeling that often comes before throwing up (vomiting). Vomiting is a reflex where stomach contents come out of your mouth. Vomiting can cause severe loss of body fluids (dehydration). Children and elderly adults can become dehydrated quickly, especially if they also have diarrhea. Nausea and vomiting are symptoms of a condition or disease. It is important to find the cause of your symptoms. °CAUSES  °· Direct irritation of the stomach lining. This irritation can result from increased acid production (gastroesophageal reflux disease), infection, food poisoning, taking certain medicines (such as nonsteroidal anti-inflammatory drugs), alcohol use, or tobacco use. °· Signals from the brain. These signals could be caused by a headache, heat exposure, an inner ear disturbance, increased pressure in the brain from injury, infection, a tumor, or a concussion, pain, emotional stimulus, or metabolic problems. °· An obstruction in the gastrointestinal tract (bowel obstruction). °· Illnesses such as diabetes, hepatitis, gallbladder problems, appendicitis, kidney problems, cancer, sepsis, atypical symptoms of a heart attack, or eating disorders. °· Medical treatments such as chemotherapy and radiation. °· Receiving medicine that makes you sleep (general anesthetic) during surgery. °DIAGNOSIS °Your caregiver may ask for tests to be done if the problems do not improve after a few days. Tests may also be done if symptoms are severe or if the reason for the nausea and vomiting is not clear. Tests may include: °· Urine tests. °· Blood tests. °· Stool tests. °· Cultures (to look for evidence of infection). °· X-rays or other imaging studies. °Test results can help your caregiver make decisions about treatment or the need for additional tests. °TREATMENT °You need to stay well hydrated. Drink frequently but in small amounts. You may wish to drink water, sports drinks, clear broth, or eat frozen  ice pops or gelatin dessert to help stay hydrated. When you eat, eating slowly may help prevent nausea. There are also some antinausea medicines that may help prevent nausea. °HOME CARE INSTRUCTIONS  °· Take all medicine as directed by your caregiver. °· If you do not have an appetite, do not force yourself to eat. However, you must continue to drink fluids. °· If you have an appetite, eat a normal diet unless your caregiver tells you differently. °¨ Eat a variety of complex carbohydrates (rice, wheat, potatoes, bread), lean meats, yogurt, fruits, and vegetables. °¨ Avoid high-fat foods because they are more difficult to digest. °· Drink enough water and fluids to keep your urine clear or pale yellow. °· If you are dehydrated, ask your caregiver for specific rehydration instructions. Signs of dehydration may include: °¨ Severe thirst. °¨ Dry lips and mouth. °¨ Dizziness. °¨ Dark urine. °¨ Decreasing urine frequency and amount. °¨ Confusion. °¨ Rapid breathing or pulse. °SEEK IMMEDIATE MEDICAL CARE IF:  °· You have blood or brown flecks (like coffee grounds) in your vomit. °· You have black or bloody stools. °· You have a severe headache or stiff neck. °· You are confused. °· You have severe abdominal pain. °· You have chest pain or trouble breathing. °· You do not urinate at least once every 8 hours. °· You develop cold or clammy skin. °· You continue to vomit for longer than 24 to 48 hours. °· You have a fever. °MAKE SURE YOU:  °· Understand these instructions. °· Will watch your condition. °· Will get help right away if you are not doing well or get worse. °Document Released: 05/24/2005 Document Revised: 08/16/2011 Document Reviewed: 10/21/2010 °[employer]® Patient Information ©2015 [employer]. This information is not intended   to replace advice given to you by your health care provider. Make sure you discuss any questions you have with your health care provider. Lipase This is a blood test that measures Lipase,  an enzyme in the blood that will often become elevated if there has been an injury to the pancreas. The Lipase will also be elevated if there are problems with the kidneys such as kidney failure. PREPARATION FOR TEST Nothing to eat for 8-12 hours prior to testing with only water permitted. NORMAL FINDINGS 0-160 Units/L or 0-160 Units/L (SI units)  Values are method dependent. Ranges for normal findings may vary among different laboratories and hospitals. You should always check with your doctor after having lab work or other tests done to discuss the meaning of your test results and whether your values are considered within normal limits. MEANING OF TEST  Your caregiver will go over the test results with you and discuss the importance and meaning of your results, as well as treatment options and the need for additional tests if necessary. OBTAINING THE TEST RESULTS It is your responsibility to obtain your test results. Ask the lab or department performing the test when and how you will get your results. Document Released: 06/18/2004 Document Revised: 08/16/2011 Document Reviewed: 05/04/2008 [facility] Patient Information 2015 [place], [place]. This information is not intended to replace advice given to you by your health care provider. Make sure you discuss any questions you have with your health care provider.

## 2014-11-22 NOTE — ED Notes (Addendum)
Pt in room with grandchildren at this time

## 2014-11-22 NOTE — ED Provider Notes (Signed)
CSN: [id number]     Arrival date & time 11/22/14  1413 History   First MD Initiated Contact with Patient 11/22/14 1503     Chief Complaint  Patient presents with  . Cough     (Consider location/radiation/quality/duration/timing/severity/associated sxs/prior Treatment) Patient is a 54 y.o. female presenting with cough. The history is provided by the patient.  Cough Cough characteristics:  Productive Associated symptoms: chest pain, chills and shortness of breath   Associated symptoms: no headaches and no rash    patient has had a cough for the last month or 2. States that she has had some production but she cannot tell me what color it was because she never looks at it. She states she's also been nausea aerated for the last 2 weeks. States over the last month she has lost around 12 pounds without trying. She's had a decreased appetite. He has had some chills and some fevers. She has a history of COPD. She continues to smoke a pack a day. She also has some upper abdominal/lower chest pain. States there is a mass there that she just noticed around a week ago. Also states she's been bruising easily for the last few weeks. States she has not had problems like this before. She's had a decreased appetite. No diarrhea. No vomiting.  Past Medical History  Diagnosis Date  . Fibromyalgia   . Degenerative disc disease     LS spine discectomy and fusion; parasternal rod  . Carpal tunnel syndrome   . Hyperlipidemia   . Ulnar neuritis   . Tobacco abuse     40 pack years; one pack per day  . Gastroesophageal reflux disease   . COPD (chronic obstructive pulmonary disease)   . Neuropathy   . Chronic neck pain   . Chronic back pain   . Shortness of breath   . PSVT (paroxysmal supraventricular tachycardia)     normal coronary angiography in 2006; records from EMS documented supraventricular tachycardia at approximately 195 bpm on 05/10/10   Past Surgical History  Procedure Laterality Date  . Lumbar  disc surgery  07/2010  . Ganglion cyst excision  2009    Right wrist  . Cesarean section  1995  . Tubal ligation  1995  . Carpal tunnel release  2009    Right  . Spinal fixation surgery w/ implant      L4, L5  . Back surgery    . Lumbar laminectomy/decompression microdiscectomy Left 09/14/2012    Procedure: LUMBAR LAMINECTOMY/DECOMPRESSION MICRODISCECTOMY 1 LEVEL;  Surgeon: Cristi Loron, MD;  Location: [facility];  Service: Neurosurgery;  Laterality: Left;  LEFT Lumbar three-four diskectomy   Family History  Problem Relation Age of Onset  . Arthritis Other   . Stroke Other   . Cancer Mother   . Diabetes Mother   . Hypertension Mother   . Thyroid disease Mother   . Diabetes Brother   . Hypertension Brother    History  Substance Use Topics  . Smoking status: Current Every Day Smoker -- 1.00 packs/day for 43 years    Types: Cigarettes  . Smokeless tobacco: Never Used  . Alcohol Use: No   OB History    Gravida Para Term Preterm AB TAB SAB Ectopic Multiple Living   Review of Systems  Constitutional: Positive for chills, appetite change and fatigue. Negative for activity change.  Eyes: Negative for pain.  Respiratory: Positive for  cough and shortness of breath. Negative for chest tightness.   Cardiovascular: Positive for chest pain. Negative for leg swelling.  Gastrointestinal: Positive for nausea. Negative for vomiting, abdominal pain and diarrhea.  Genitourinary: Negative for flank pain.  Musculoskeletal: Negative for back pain and neck stiffness.  Skin: Negative for rash and wound.  Neurological: Positive for light-headedness. Negative for weakness, numbness and headaches.  Hematological: Bruises/bleeds easily.  Psychiatric/Behavioral: Negative for behavioral problems.      Allergies  Sulfonamide derivatives  Home Medications   Prior to Admission medications   Medication Sig Start Date End Date Taking? Authorizing Provider  DULoxetine  (CYMBALTA) 30 MG capsule Take 1 capsule by mouth 2 (two) times daily. 06/11/13  Yes Historical Provider, MD  gabapentin (NEURONTIN) 300 MG capsule Take 1 capsule by mouth 3 (three) times daily. 06/11/13  Yes Historical Provider, MD  oxyCODONE-acetaminophen (PERCOCET) 10-325 MG per tablet Take 1 tablet by mouth every 6 (six) hours as needed for pain.   Yes Historical Provider, MD  doxycycline (VIBRAMYCIN) 100 MG capsule Take 1 capsule (100 mg total) by mouth 2 (two) times daily. 11/22/14   Benjiman Core, MD  promethazine (PHENERGAN) 25 MG tablet Take 1 tablet (25 mg total) by mouth every 6 (six) hours as needed for nausea. 11/22/14   Benjiman Core, MD   BP 107/72 mmHg  Pulse 73  Temp(Src) 97.7 F (36.5 C) (Oral)  Resp 16  Ht  (1.727 m)  Wt 171 lb 1.6 oz (77.61 kg)  BMI 26.02 kg/m2  SpO2 98% Physical Exam  Constitutional: She is oriented to person, place, and time. She appears well-developed and well-nourished.  HENT:  Head: Normocephalic and atraumatic.  Eyes: EOM are normal. Pupils are equal, round, and reactive to light.  Neck: Normal range of motion. Neck supple.  Cardiovascular: Normal rate, regular rhythm and normal heart sounds.   No murmur heard. Pulmonary/Chest: Effort normal. No respiratory distress. She has wheezes. She has no rales. She exhibits tenderness.  Few scattered wheezes. Somewhat tender mass that appears to be in the xiphoid area and somewhat lateral to the right. No skin changes.  Abdominal: Soft. Bowel sounds are normal. She exhibits no distension. There is no tenderness. There is no rebound.  Musculoskeletal: Normal range of motion. She exhibits no edema.  Neurological: She is alert and oriented to person, place, and time. No cranial nerve deficit.  Skin: Skin is warm and dry.  Psychiatric: Her speech is normal.  Nursing note and vitals reviewed.   ED Course  Procedures (including critical care time) Labs Review Labs Reviewed  COMPREHENSIVE METABOLIC  PANEL - Abnormal; Notable for the following:    Glucose, Bld 120 (*)    Calcium 8.7 (*)    AST 12 (*)    ALT 9 (*)    All other components within normal limits  LIPASE, BLOOD - Abnormal; Notable for the following:    Lipase 136 (*)    All other components within normal limits  CBC WITH DIFFERENTIAL/PLATELET    Imaging Review Dg Chest 2 View  11/22/2014   CLINICAL DATA:  Coughing, cough for 1 month, nausea, loss of appetite  EXAM: CHEST  2 VIEW  COMPARISON:  06/15/2013, 08/11/2009  FINDINGS: There is a stable 7 mm right midlung pulmonary nodule unchanged compared with 08/11/2009. There is no focal parenchymal opacity. There is no pleural effusion or pneumothorax. The heart and mediastinal contours are unremarkable.  The osseous structures are unremarkable.  IMPRESSION: No active cardiopulmonary disease.  Electronically Signed   By: Elige Ko   On: 11/22/2014 15:10   Ct Chest W Contrast  11/22/2014   CLINICAL DATA:  54 year old female with cough, nausea, loss of appetite. Initial encounter.  EXAM: CT CHEST, ABDOMEN, AND PELVIS WITH CONTRAST  TECHNIQUE: Multidetector CT imaging of the chest, abdomen and pelvis was performed following the standard protocol during bolus administration of intravenous contrast.  CONTRAST:  50mL OMNIPAQUE IOHEXOL 300 MG/ML SOLN, OMNIPAQUE IOHEXOL 300 MG/ML SOLN  COMPARISON:  Chest radiographs 1446 hr today. Lumbar spine CT myelogram 03/19/2013. CT Abdomen 01/07/2004  FINDINGS: CT CHEST FINDINGS  Major airways are patent. There is upper lobe centrilobular and to a lesser extent paraseptal emphysema. Right lower lobe calcified granuloma. No confluent pulmonary opacity.  No pleural or pericardial effusion.  Negative thoracic inlet. No axillary lymphadenopathy. No mediastinal or hilar lymphadenopathy. Calcified Coronary artery atherosclerosis is evident (series 3, image 35).  Thoracic scoliosis. No acute osseous abnormality identified.  CT ABDOMEN AND PELVIS FINDINGS   Lumbar scoliosis. Previous L5-S1 decompression and fusion No acute osseous abnormality identified.  No pelvic free fluid. Negative uterus and adnexa. Negative rectum. Unremarkable bladder.  Retained stool in the sigmoid and left colon. Less retained stool in the transverse and right colon. No large bowel inflammation. The appendix is not identified and might be surgically absent. Negative terminal ileum. Oral contrast administered but has not yet reached the TI. No dilated small bowel. The stomach is distended with contrast. Negative duodenum.  Mild motion artifact in the upper abdomen. Contracted gallbladder. Liver, spleen (calcified granulomas), pancreas and adrenal glands are stable since 2005 and within normal limits. The portal venous system is patent. Aortoiliac calcified atherosclerosis noted. Major arterial structures in the abdomen and pelvis are patent. No abdominal free fluid. No lymphadenopathy. Involuted or resected right lateral midpole renal cystic lesion since 2005 with mild cortical scarring (series 3, image 77). Renal contrast enhancement and excretion are within normal limits.  IMPRESSION: 1. No acute or inflammatory findings in the chest. Emphysema and Coronary artery calcified atherosclerosis. 2. No acute or inflammatory findings in the abdomen or pelvis. Sequelae of prior granulomatous disease.   Electronically Signed   By: Odessa Fleming M.D.   On: 11/22/2014 18:11   Ct Abdomen Pelvis W Contrast  11/22/2014   CLINICAL DATA:  54 year old female with cough, nausea, loss of appetite. Initial encounter.  EXAM: CT CHEST, ABDOMEN, AND PELVIS WITH CONTRAST  TECHNIQUE: Multidetector CT imaging of the chest, abdomen and pelvis was performed following the standard protocol during bolus administration of intravenous contrast.  CONTRAST:  50mL OMNIPAQUE IOHEXOL 300 MG/ML SOLN, OMNIPAQUE IOHEXOL 300 MG/ML SOLN  COMPARISON:  Chest radiographs 1446 hr today. Lumbar spine CT myelogram 03/19/2013. CT Abdomen  01/07/2004  FINDINGS: CT CHEST FINDINGS  Major airways are patent. There is upper lobe centrilobular and to a lesser extent paraseptal emphysema. Right lower lobe calcified granuloma. No confluent pulmonary opacity.  No pleural or pericardial effusion.  Negative thoracic inlet. No axillary lymphadenopathy. No mediastinal or hilar lymphadenopathy. Calcified Coronary artery atherosclerosis is evident (series 3, image 35).  Thoracic scoliosis. No acute osseous abnormality identified.  CT ABDOMEN AND PELVIS FINDINGS  Lumbar scoliosis. Previous L5-S1 decompression and fusion No acute osseous abnormality identified.  No pelvic free fluid. Negative uterus and adnexa. Negative rectum. Unremarkable bladder.  Retained stool in the sigmoid and left colon. Less retained stool in the transverse and right colon. No large bowel inflammation. The appendix is not identified and  might be surgically absent. Negative terminal ileum. Oral contrast administered but has not yet reached the TI. No dilated small bowel. The stomach is distended with contrast. Negative duodenum.  Mild motion artifact in the upper abdomen. Contracted gallbladder. Liver, spleen (calcified granulomas), pancreas and adrenal glands are stable since 2005 and within normal limits. The portal venous system is patent. Aortoiliac calcified atherosclerosis noted. Major arterial structures in the abdomen and pelvis are patent. No abdominal free fluid. No lymphadenopathy. Involuted or resected right lateral midpole renal cystic lesion since 2005 with mild cortical scarring (series 3, image 77). Renal contrast enhancement and excretion are within normal limits.  IMPRESSION: 1. No acute or inflammatory findings in the chest. Emphysema and Coronary artery calcified atherosclerosis. 2. No acute or inflammatory findings in the abdomen or pelvis. Sequelae of prior granulomatous disease.   Electronically Signed   By: Odessa Fleming M.D.   On: 11/22/2014 18:11     EKG  Interpretation None      MDM   Final diagnoses:  Elevated lipase  Non-intractable vomiting with nausea, vomiting of unspecified type  Bronchitis    Patient with cough weight loss nausea and vomiting and abdominal pain. Has elevated lipase but no clear cause for it. Will discharge home and follow with her PCP. Has had cough with some production for a few months and will treat with robotics.    Benjiman Core, MD 11/23/14 [phone]

## 2014-11-22 NOTE — ED Notes (Signed)
Pt states cough x 1 month. Nausea and "I found a big knot right here" (pointing to her upper chest).

## 2014-12-02 ENCOUNTER — Telehealth: Payer: Self-pay | Admitting: *Deleted

## 2014-12-02 NOTE — Telephone Encounter (Signed)
Pt called stating she was seen in the ER and they told her to make a appt here for her pancreas. Can we see this pt?  Pt's number is [phone]

## 2014-12-04 NOTE — Telephone Encounter (Signed)
I called pt no answer no voice mail, I scheduled ot for 12/25/14 at 2:30 with Minerva AreolaEric appt letter mailed.

## 2014-12-04 NOTE — Telephone Encounter (Signed)
Patient in ER with abdominal pain/nausea and elevated Lipase (136) with unknown etiology per ER MD. Recommended GI follow-up. We saw the patient last in 2012.  Please schedule the patient with an open (non-urgent) slot.  Thanks!

## 2014-12-25 ENCOUNTER — Ambulatory Visit (INDEPENDENT_AMBULATORY_CARE_PROVIDER_SITE_OTHER): Payer: Medicare Other | Admitting: Nurse Practitioner

## 2014-12-25 ENCOUNTER — Encounter: Payer: Self-pay | Admitting: Nurse Practitioner

## 2014-12-25 VITALS — BP 120/72 | HR 78 | Temp 97.2°F | Ht 67.0 in | Wt 167.6 lb

## 2014-12-25 DIAGNOSIS — R748 Abnormal levels of other serum enzymes: Secondary | ICD-10-CM | POA: Diagnosis not present

## 2014-12-25 DIAGNOSIS — R634 Abnormal weight loss: Secondary | ICD-10-CM

## 2014-12-25 DIAGNOSIS — R109 Unspecified abdominal pain: Secondary | ICD-10-CM | POA: Insufficient documentation

## 2014-12-25 DIAGNOSIS — R1010 Upper abdominal pain, unspecified: Secondary | ICD-10-CM | POA: Diagnosis not present

## 2014-12-25 MED ORDER — ONDANSETRON HCL 4 MG PO TABS: 4.0000 mg | ORAL_TABLET | Freq: Three times a day (TID) | ORAL | Status: DC | PRN

## 2014-12-25 MED ORDER — OMEPRAZOLE 20 MG PO CPDR: 20.0000 mg | DELAYED_RELEASE_CAPSULE | Freq: Every day | ORAL | Status: DC

## 2014-12-25 NOTE — Assessment & Plan Note (Signed)
Patient presented to the emergency room with cough as well as abdominal pain. She had a slight increase in her lipase to 157 but CT was negative for any pancreatic manifestations. She is also having abdominal pain. We will recheck her lipase today to evaluate where it is at. Have her return for follow-up in 6-8 weeks.

## 2014-12-25 NOTE — Patient Instructions (Addendum)
1. Have your labs drawn in the next day or 2 as you're able. 2. I've sent in prescriptions for you for Zofran that you can take every 8 hours as needed for nausea. I also sent in a prescription for Prilosec 20 mg take it once daily 30 minutes before your first meal of the day. 3. We will have he sign a release of information so we can request records from your last colonoscopy. 4. Return for further evaluation in 6-8 weeks.

## 2014-12-25 NOTE — Assessment & Plan Note (Signed)
Patient complains of subjective weight loss of anywhere from 12-16 pounds in the past couple months. This is unable to be verified objectively due to last weight in the system was approximately a year and a half to 2 years ago and was at 170 pounds. No for recent emergency room visits have a weight recorded. Her last colonoscopy was approximately 5 years ago per her and [facility]. These results are not found in the system but we will request them. Given her constellation of symptoms possible etiologies for her presentation could be gastritis/GERD/reflux esophagitis versus chronic pancreatitis versus pancreatic insufficiency. Cannot rule out other process. We will request her colonoscopy records to review, start her on omeprazole daily, recheck her lipase, and have her return in 6-8 weeks. Pending these results we can make further plans.

## 2014-12-25 NOTE — Progress Notes (Signed)
Primary Care Physician:  Rudi Heap, MD Primary Gastroenterologist:  Dr. Jena Gauss  Chief Complaint  Patient presents with  . Abdominal Pain  . Nausea    HPI:   54 year old female presents for follow-up in emergency room visit. ER physician note reviewed. The patient was in the emergency room 11/22/2014 where she admitted nausea and vomiting for the past 2 weeks and unintentional weight loss of 12 pounds over the previous month as well as decreased appetite. Also noted upper abdominal pain. In the ER CBC was normal and lipase was elevated at 136. CT abdomen and pelvis found no acute or inflammatory findings in the abdomen or pelvis, sequela of prior granulomatous disease. In the spleen calcified granulomas were present and stated pancreas and adrenal glands stable since 2005 and within normal limits. CT in 2005 of the abdomen and pelvis found questionable cyst and follow-up MRI found unremarkable pancreatic head and uncinate process.  Today she states she has continued nausea and weight loss. She states she used to weigh 183 and now weighs 166. She has nausea in the mornings, before and after eating with every meal. Occasionally vomits about 2 times a week. Denies hematochezia and melena. Denies recent fever, chills. States her last colonoscopy was "years ago, probably at least 5 years ago." She states it was done at either [facility] or [facility] (although no report could be found in the system.) Admits LUQ abdominal pain which is the same kind of pain she had in the emergency room and is described as both sharp and burning. The pain is intermittent. The pattern of onset is random and is daily. She also admits dyspepsia-type symptoms including esophageal burning, belching, bitter taste. Denies chest pain, dizziness, lightheadedness, syncope, near syncope. Admits chronic dyspnea related to COPD history. Denies any other upper or lower GI symptoms.  Past Medical History  Diagnosis Date    . Fibromyalgia   . Degenerative disc disease     LS spine discectomy and fusion; parasternal rod  . Carpal tunnel syndrome   . Hyperlipidemia   . Ulnar neuritis   . Tobacco abuse     40 pack years; one pack per day  . Gastroesophageal reflux disease   . COPD (chronic obstructive pulmonary disease)   . Neuropathy   . Chronic neck pain   . Chronic back pain   . Shortness of breath   . PSVT (paroxysmal supraventricular tachycardia)     normal coronary angiography in 2006; records from EMS documented supraventricular tachycardia at approximately 195 bpm on 05/10/10    Past Surgical History  Procedure Laterality Date  . Lumbar disc surgery  07/2010  . Ganglion cyst excision  2009    Right wrist  . Cesarean section  1995  . Tubal ligation  1995  . Carpal tunnel release  2009    Right  . Spinal fixation surgery w/ implant      L4, L5  . Back surgery    . Lumbar laminectomy/decompression microdiscectomy Left 09/14/2012    Procedure: LUMBAR LAMINECTOMY/DECOMPRESSION MICRODISCECTOMY 1 LEVEL;  Surgeon: Cristi Loron, MD;  Location: [facility];  Service: Neurosurgery;  Laterality: Left;  LEFT Lumbar three-four diskectomy    Current Outpatient Prescriptions  Medication Sig Dispense Refill  . DULoxetine (CYMBALTA) 30 MG capsule Take 1 capsule by mouth 2 (two) times daily.    Marland Kitchen gabapentin (NEURONTIN) 300 MG capsule Take 1 capsule by mouth 3 (three) times daily.    Marland Kitchen oxyCODONE-acetaminophen (PERCOCET)  10-325 MG per tablet Take 1 tablet by mouth every 6 (six) hours as needed for pain.    . promethazine (PHENERGAN) 25 MG tablet Take 1 tablet (25 mg total) by mouth every 6 (six) hours as needed for nausea. 10 tablet 0  . tiZANidine (ZANAFLEX) 4 MG capsule Take 4 mg by mouth daily.    Marland Kitchen. doxycycline (VIBRAMYCIN) 100 MG capsule Take 1 capsule (100 mg total) by mouth 2 (two) times daily. (Patient not taking: Reported on 12/25/2014) 10 capsule 0   No current facility-administered medications  for this visit.    Allergies as of 12/25/2014 - Review Complete 12/25/2014  Allergen Reaction Noted  . Sulfonamide derivatives Anaphylaxis and Rash     Family History  Problem Relation Age of Onset  . Arthritis Other   . Stroke Other   . Cancer Mother   . Diabetes Mother   . Hypertension Mother   . Thyroid disease Mother   . Diabetes Brother   . Hypertension Brother     History   Social History  . Marital Status: Single    Spouse Name: N/A  . Number of Children: N/A  . Years of Education: N/A   Occupational History  . Unemployed     Disabled   Social History Main Topics  . Smoking status: Current Every Day Smoker -- 1.00 packs/day for 43 years    Types: Cigarettes  . Smokeless tobacco: Never Used  . Alcohol Use: No  . Drug Use: No  . Sexual Activity: Yes    Birth Control/ Protection: Surgical   Other Topics Concern  . Not on file   Social History Narrative   Divorced.    Review of Systems: General: Negative for anorexia, fever, chills, fatigue, weakness. Eyes: Negative for vision changes.  ENT: Negative for hoarseness, difficulty swallowing. CV: Negative for chest pain, angina, palpitations, peripheral edema.  Respiratory: Negative for worsening cough, sputum, wheezing.  GI: See history of present illness. Derm: Negative for rash or itching.  Endo: Negative for unusual weight change.  Heme: Negative for bruising or bleeding. Allergy: Negative for rash or hives.    Physical Exam: BP 120/72 mmHg  Pulse 78  Temp(Src) 97.2 F (36.2 C)  Ht 5\' 7"  (1.702 m)  Wt 167 lb 9.6 oz (76.023 kg)  BMI 26.24 kg/m2 General:   Alert and oriented. Pleasant and cooperative. Well-nourished and well-developed.  Head:  Normocephalic and atraumatic. Eyes:  Without icterus, sclera clear and conjunctiva pink.  Ears:  Normal auditory acuity. Cardiovascular:  S1, S2 present without murmurs appreciated. Extremities without clubbing or edema. Respiratory:  Clear to  auscultation bilaterally. No wheezes, rales, or rhonchi. No distress.  Gastrointestinal:  +BS, soft, and non-distended. Epigastric and LUQ TTP noted. No HSM noted. No guarding or rebound. No masses appreciated.  Rectal:  Deferred  Musculoskalatal:  Symmetrical without gross deformities. Normal posture. Skin:  Intact without significant lesions or rashes. Neurologic:  Alert and oriented x4;  grossly normal neurologically. Psych:  Alert and cooperative. Normal mood and affect. Heme/Lymph/Immune: No excessive bruising noted.    12/25/2014 3:01 PM

## 2014-12-25 NOTE — Assessment & Plan Note (Signed)
Patient with a history of GERD and abdominal pain which is primarily left upper quadrant and epigastric. Worse with palpation. She did have a slight bump in her lipase in the emergency room however CT was negative for any signs of pancreatitis. She is also complaining of esophageal burning, bitter taste in her mouth, excessive belching, and nausea. Her symptoms could be due to reflux esophagitis versus chronic pancreatitis versus pancreatic insufficiency. At this point I will recheck her lipase, start her on omeprazole 20 mg daily, sending a prescription for Zofran 4 mg every 8 hours as needed for nausea, and have her return in 6-8 weeks for further evaluation.

## 2014-12-26 NOTE — Progress Notes (Signed)
CC'ED TO PCP 

## 2014-12-27 LAB — LIPASE: LIPASE: 262 U/L — AB (ref 0–75)

## 2015-01-06 ENCOUNTER — Ambulatory Visit (HOSPITAL_COMMUNITY)
Admission: RE | Admit: 2015-01-06 | Discharge: 2015-01-06 | Disposition: A | Discharge status: Home / Self Care | Payer: Medicare Other | Source: Ambulatory Visit | Attending: Gastroenterology | Admitting: Gastroenterology

## 2015-01-06 ENCOUNTER — Other Ambulatory Visit: Payer: Self-pay | Admitting: Gastroenterology

## 2015-01-06 ENCOUNTER — Other Ambulatory Visit: Payer: Self-pay

## 2015-01-06 DIAGNOSIS — R109 Unspecified abdominal pain: Secondary | ICD-10-CM

## 2015-01-06 DIAGNOSIS — R935 Abnormal findings on diagnostic imaging of other abdominal regions, including retroperitoneum: Secondary | ICD-10-CM | POA: Diagnosis not present

## 2015-01-06 DIAGNOSIS — R634 Abnormal weight loss: Secondary | ICD-10-CM | POA: Diagnosis not present

## 2015-01-06 DIAGNOSIS — R1012 Left upper quadrant pain: Secondary | ICD-10-CM | POA: Diagnosis not present

## 2015-01-06 DIAGNOSIS — E278 Other specified disorders of adrenal gland: Secondary | ICD-10-CM | POA: Diagnosis not present

## 2015-01-06 DIAGNOSIS — D7389 Other diseases of spleen: Secondary | ICD-10-CM | POA: Diagnosis not present

## 2015-01-06 MED ORDER — PROMETHAZINE HCL 25 MG PO TABS: 12.5000 mg | ORAL_TABLET | Freq: Four times a day (QID) | ORAL | Status: DC | PRN

## 2015-01-06 MED ORDER — IOHEXOL 300 MG/ML  SOLN
100.0000 mL | Freq: Once | INTRAMUSCULAR | Status: AC | PRN
  Administered 2015-01-06: 100 mL via INTRAVENOUS

## 2015-01-06 NOTE — Progress Notes (Signed)
Quick Note:  I called pt. She is aware of the results. Said she is having a lot of nausea, occasional vomiting. She has left side lower abdominal pain and also in the rib cage. She rates the pain at a 5 now. She has loss of appetite and said she is still losing weight. She did request phenergan for nausea since the zofran is not working. ______

## 2015-01-06 NOTE — Progress Notes (Signed)
Quick Note:  Lipase increased. No CT changes of pancreatitis in June 2016 when lipase was in 136 range. What are her symptoms? If she is having worsening pain, needs CT abd/pelvis stat. IF pain is at baseline and unchanged, recommend MRI/MRCP to further evaluation pancreas due to concern for occult etiology. ______

## 2015-01-06 NOTE — Progress Notes (Signed)
Quick Note:  Pt is aware and routing to [facility] Clinical Pool to schedule. ______

## 2015-01-06 NOTE — Progress Notes (Signed)
Quick Note:  Do stat CT with pancreatic protocol if possible. If this is negative, she will need an EGD. ______

## 2015-01-07 NOTE — Progress Notes (Signed)
Quick Note:  Pancreas appears normal on CT.  Lipase was 260 range July 20th.  There has not been CT evidence of pancreatitis. Although she could have subclinical pancreatitis, we can't rule out other causes of elevated lipase (?ulcer disease). With her persistent symptoms of dyspepsia, I would recommend EGD with Propofol with Dr. Jena Gauss as soon as possible. I am going to discuss with Dr. Darrick Penna this case in the absence of Dr. Jena Gauss. How is she doing today?   ______

## 2015-01-08 ENCOUNTER — Telehealth: Payer: Self-pay | Admitting: Gastroenterology

## 2015-01-08 DIAGNOSIS — E785 Hyperlipidemia, unspecified: Secondary | ICD-10-CM

## 2015-01-08 DIAGNOSIS — R109 Unspecified abdominal pain: Secondary | ICD-10-CM

## 2015-01-08 NOTE — Progress Notes (Signed)
Quick Note:  No, she needs to stick with Dr. Jena Gauss.  Let's bring her in for a visit to arrange EGD. I would rather have her seen again before setting up an EGD to ensure we have covered all the bases. Please arrange with Minerva Areola, who has seen patient recently. ______

## 2015-01-08 NOTE — Telephone Encounter (Signed)
Reviewed briefly with Dr. Darrick Penna. In order to be thorough, let's check a lipid panel to ensure her triglycerides are not a culprit for pancreatitis. Also, has she started any new medications recently?   Still needs EGD set up with Dr. Jena Gauss. Would also recommend EUS in future to check for microlithiasis and any other abnormalities that could be precipitating pancreatitis.

## 2015-01-09 ENCOUNTER — Other Ambulatory Visit: Payer: Self-pay

## 2015-01-09 DIAGNOSIS — R109 Unspecified abdominal pain: Secondary | ICD-10-CM

## 2015-01-09 DIAGNOSIS — E785 Hyperlipidemia, unspecified: Secondary | ICD-10-CM

## 2015-01-09 NOTE — Telephone Encounter (Signed)
Susan Potts spoke with the pt. See result note. Lab orders have been done.

## 2015-01-23 ENCOUNTER — Ambulatory Visit: Payer: Medicare Other | Admitting: Gastroenterology

## 2015-01-24 ENCOUNTER — Encounter: Payer: Self-pay | Admitting: Nurse Practitioner

## 2015-01-24 ENCOUNTER — Ambulatory Visit: Payer: Medicare Other | Admitting: Nurse Practitioner

## 2015-01-24 ENCOUNTER — Other Ambulatory Visit: Payer: Self-pay | Admitting: Gastroenterology

## 2015-01-28 ENCOUNTER — Other Ambulatory Visit: Payer: Self-pay | Admitting: Nurse Practitioner

## 2015-01-28 MED ORDER — PROMETHAZINE HCL 25 MG PO TABS: ORAL_TABLET | ORAL | Status: DC

## 2015-01-29 ENCOUNTER — Encounter (HOSPITAL_COMMUNITY): Payer: Self-pay

## 2015-01-29 ENCOUNTER — Emergency Department (HOSPITAL_COMMUNITY)
Admission: EM | Admit: 2015-01-29 | Discharge: 2015-01-29 | Disposition: A | Discharge status: Home / Self Care | Payer: Medicare Other | Attending: Emergency Medicine | Admitting: Emergency Medicine

## 2015-01-29 ENCOUNTER — Emergency Department (HOSPITAL_COMMUNITY): Payer: Medicare Other

## 2015-01-29 DIAGNOSIS — Z720 Tobacco use: Secondary | ICD-10-CM | POA: Diagnosis not present

## 2015-01-29 DIAGNOSIS — G8929 Other chronic pain: Secondary | ICD-10-CM | POA: Diagnosis not present

## 2015-01-29 DIAGNOSIS — J449 Chronic obstructive pulmonary disease, unspecified: Secondary | ICD-10-CM | POA: Diagnosis not present

## 2015-01-29 DIAGNOSIS — G629 Polyneuropathy, unspecified: Secondary | ICD-10-CM | POA: Diagnosis not present

## 2015-01-29 DIAGNOSIS — W271XXA Contact with garden tool, initial encounter: Secondary | ICD-10-CM | POA: Diagnosis not present

## 2015-01-29 DIAGNOSIS — E785 Hyperlipidemia, unspecified: Secondary | ICD-10-CM | POA: Insufficient documentation

## 2015-01-29 DIAGNOSIS — Y9289 Other specified places as the place of occurrence of the external cause: Secondary | ICD-10-CM | POA: Diagnosis not present

## 2015-01-29 DIAGNOSIS — S6991XA Unspecified injury of right wrist, hand and finger(s), initial encounter: Secondary | ICD-10-CM | POA: Diagnosis not present

## 2015-01-29 DIAGNOSIS — Y9389 Activity, other specified: Secondary | ICD-10-CM | POA: Insufficient documentation

## 2015-01-29 DIAGNOSIS — S61219A Laceration without foreign body of unspecified finger without damage to nail, initial encounter: Secondary | ICD-10-CM

## 2015-01-29 DIAGNOSIS — T1490XA Injury, unspecified, initial encounter: Secondary | ICD-10-CM

## 2015-01-29 DIAGNOSIS — Z79899 Other long term (current) drug therapy: Secondary | ICD-10-CM | POA: Insufficient documentation

## 2015-01-29 DIAGNOSIS — Y998 Other external cause status: Secondary | ICD-10-CM | POA: Insufficient documentation

## 2015-01-29 DIAGNOSIS — K219 Gastro-esophageal reflux disease without esophagitis: Secondary | ICD-10-CM | POA: Diagnosis not present

## 2015-01-29 DIAGNOSIS — M797 Fibromyalgia: Secondary | ICD-10-CM | POA: Insufficient documentation

## 2015-01-29 DIAGNOSIS — M79644 Pain in right finger(s): Secondary | ICD-10-CM | POA: Diagnosis not present

## 2015-01-29 DIAGNOSIS — S61216A Laceration without foreign body of right little finger without damage to nail, initial encounter: Secondary | ICD-10-CM | POA: Insufficient documentation

## 2015-01-29 DIAGNOSIS — Z23 Encounter for immunization: Secondary | ICD-10-CM | POA: Diagnosis not present

## 2015-01-29 DIAGNOSIS — S61212A Laceration without foreign body of right middle finger without damage to nail, initial encounter: Secondary | ICD-10-CM | POA: Diagnosis not present

## 2015-01-29 MED ORDER — HYDROCODONE-ACETAMINOPHEN 5-325 MG PO TABS: 1.0000 | ORAL_TABLET | ORAL | Status: DC | PRN

## 2015-01-29 MED ORDER — HYDROCODONE-ACETAMINOPHEN 5-325 MG PO TABS
2.0000 | ORAL_TABLET | Freq: Once | ORAL | Status: AC
Start: 2015-01-29 — End: 2015-01-29
  Administered 2015-01-29: 2 via ORAL
  Filled 2015-01-29: qty 2

## 2015-01-29 MED ORDER — LIDOCAINE HCL (PF) 2 % IJ SOLN
10.0000 mL | Freq: Once | INTRAMUSCULAR | Status: AC
  Administered 2015-01-29: 10 mL
  Filled 2015-01-29: qty 10

## 2015-01-29 MED ORDER — TETANUS-DIPHTH-ACELL PERTUSSIS 5-2.5-18.5 LF-MCG/0.5 IM SUSP
0.5000 mL | Freq: Once | INTRAMUSCULAR | Status: AC
  Administered 2015-01-29: 0.5 mL via INTRAMUSCULAR
  Filled 2015-01-29: qty 0.5

## 2015-01-29 NOTE — ED Notes (Signed)
Pt verbalized understanding of no driving and to use caution within 4 hours of taking pain meds due to meds cause drowsiness 

## 2015-01-29 NOTE — ED Notes (Signed)
Laceration to right 5th finger. No bleeding at this time.

## 2015-01-29 NOTE — ED Provider Notes (Signed)
CSN: [id number]     Arrival date & time 01/29/15  1820 History   First MD Initiated Contact with Patient 01/29/15 2101     Chief Complaint  Patient presents with  . Extremity Laceration     (Consider location/radiation/quality/duration/timing/severity/associated sxs/prior Treatment) HPI Comments: Patient is a 54 year old female who presents to the emergency department with a complaint of laceration to the right fifth finger.  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 She had an incident with the pull cord, and sustained a laceration to the right fifth finger approximately 5:30 this afternoon. The patient denies being on any anticoagulation medications. There's been no previous operations or procedures involving the right fifth finger. She has been able to keep the bleeding under control with pressure dressing.  The history is provided by the patient.    Past Medical History  Diagnosis Date  . Fibromyalgia   . Degenerative disc disease     LS spine discectomy and fusion; parasternal rod  . Carpal tunnel syndrome   . Hyperlipidemia   . Ulnar neuritis   . Tobacco abuse     40 pack years; one pack per day  . Gastroesophageal reflux disease   . COPD (chronic obstructive pulmonary disease)   . Neuropathy   . Chronic neck pain   . Chronic back pain   . Shortness of breath   . PSVT (paroxysmal supraventricular tachycardia)     normal coronary angiography in 2006; records from EMS documented supraventricular tachycardia at approximately 195 bpm on 05/10/10   Past Surgical History  Procedure Laterality Date  . Lumbar disc surgery  07/2010  . Ganglion cyst excision  2009    Right wrist  . Cesarean section  1995  . Tubal ligation  1995  . Carpal tunnel release  2009    Right  . Spinal fixation surgery w/ implant      L4, L5  . Back surgery    . Lumbar laminectomy/decompression microdiscectomy Left 09/14/2012    Procedure: LUMBAR LAMINECTOMY/DECOMPRESSION MICRODISCECTOMY 1  LEVEL;  Surgeon: Cristi Loron, MD;  Location: [facility];  Service: Neurosurgery;  Laterality: Left;  LEFT Lumbar three-four diskectomy   Family History  Problem Relation Age of Onset  . Arthritis Other   . Stroke Other   . Cancer Mother   . Diabetes Mother   . Hypertension Mother   . Thyroid disease Mother   . Diabetes Brother   . Hypertension Brother   . Colon cancer Neg Hx    Social History  Substance Use Topics  . Smoking status: Current Every Day Smoker -- 1.00 packs/day for 43 years    Types: Cigarettes  . Smokeless tobacco: Never Used  . Alcohol Use: No   OB History    Gravida Para Term Preterm AB TAB SAB Ectopic Multiple Living   2 2 2       2      Review of Systems  Musculoskeletal: Positive for back pain and arthralgias.  All other systems reviewed and are negative.     Allergies  Sulfonamide derivatives  Home Medications   Prior to Admission medications   Medication Sig Start Date End Date Taking? Authorizing Provider  DULoxetine (CYMBALTA) 30 MG capsule Take 1 capsule by mouth 2 (two) times daily. 06/11/13  Yes Historical Provider, MD  gabapentin (NEURONTIN) 300 MG capsule Take 1 capsule by mouth 3 (three) times daily. 06/11/13  Yes Historical Provider, MD  omeprazole (PRILOSEC) 20 MG capsule Take 1 capsule (  20 mg total) by mouth daily. 30 minutes before the first meal of the day. 12/25/14  Yes Anice Paganini, NP  oxyCODONE-acetaminophen (PERCOCET) 10-325 MG per tablet Take 1 tablet by mouth every 6 (six) hours as needed for pain.   Yes Historical Provider, MD  promethazine (PHENERGAN) 25 MG tablet TAKE 1/2 TAB EVERY 6 HOURS AS NEEDED FOR NAUSEA 01/28/15  Yes Anice Paganini, NP  tiZANidine (ZANAFLEX) 4 MG capsule Take 4 mg by mouth daily.   Yes Historical Provider, MD  ondansetron (ZOFRAN) 4 MG tablet Take 1 tablet (4 mg total) by mouth every 8 (eight) hours as needed for nausea or vomiting. 12/25/14   Anice Paganini, NP   BP 134/84 mmHg  Pulse 70  Temp(Src) 97.6  F (36.4 C) (Oral)  Resp 20  Ht 5\' 7"  (1.702 m)  Wt 169 lb (76.658 kg)  BMI 26.46 kg/m2  SpO2 100% Physical Exam  Constitutional: She is oriented to person, place, and time. She appears well-developed and well-nourished.  Non-toxic appearance.  HENT:  Head: Normocephalic.  Right Ear: Tympanic membrane and external ear normal.  Left Ear: Tympanic membrane and external ear normal.  Eyes: EOM and lids are normal. Pupils are equal, round, and reactive to light.  Neck: Normal range of motion. Neck supple. Carotid bruit is not present.  Cardiovascular: Normal rate, regular rhythm, normal heart sounds, intact distal pulses and normal pulses.   Pulmonary/Chest: Breath sounds normal. No respiratory distress.  Abdominal: Soft. Bowel sounds are normal. There is no tenderness. There is no guarding.  Musculoskeletal: Normal range of motion.       Right hand: She exhibits tenderness and laceration. She exhibits normal capillary refill.       Hands: Lymphadenopathy:       Head (right side): No submandibular adenopathy present.       Head (left side): No submandibular adenopathy present.    She has no cervical adenopathy.  Neurological: She is alert and oriented to person, place, and time. She has normal strength. No cranial nerve deficit or sensory deficit.  Skin: Skin is warm and dry.  Psychiatric: She has a normal mood and affect. Her speech is normal.  Nursing note and vitals reviewed.   ED Course 7 sutures 4-0 nylon  LACERATION REPAIR Date/Time: 02/01/2015 3:48 PM Performed by: Ivery Quale Authorized by: Ivery Quale Consent: Verbal consent obtained. Risks and benefits: risks, benefits and alternatives were discussed Consent given by: patient Patient understanding: patient states understanding of the procedure being performed Patient identity confirmed: arm band Time out: Immediately prior to procedure a "time out" was called to verify the correct patient, procedure, equipment,  support staff and site/side marked as required. Body area: upper extremity Location details: right small finger Laceration length: 2 cm Foreign bodies: no foreign bodies Anesthesia: hematoma block Local anesthetic: lidocaine 2% without epinephrine Patient sedated: no Preparation: Patient was prepped and draped in the usual sterile fashion. Irrigation solution: saline Skin closure: 4-0 nylon Number of sutures: 7 Technique: simple Approximation: close Approximation difficulty: simple Dressing: gauze roll Patient tolerance: Patient tolerated the procedure well with no immediate complications   (including critical care time) Labs Review Labs Reviewed - No data to display  Imaging Review No results found. I have personally reviewed and evaluated these images and lab results as part of my medical decision-making.   EKG Interpretation None      MDM  Vital signs reviewed. Patient sustained a laceration to the right fifth finger. Wound  repaired with 7 sutures of 4-0 nylon. Patient tolerated procedure without problem. Patient is to have the sutures removed in 7-10 days. Patient will return to the emergency department or follow-up with the primary physician if not improving.    Final diagnoses:  Injury    *I have reviewed nursing notes, vital signs, and all appropriate lab and imaging results for this patient.[address], PA-C 02/01/15 1552  Rolland Porter, MD 02/09/15 [phone]

## 2015-01-29 NOTE — Discharge Instructions (Signed)
Please keep the sutured wound clean and dry. Please have the sutures removed in 7 days. Sutured Wound Care Sutures are stitches that can be used to close wounds. Wound care helps prevent pain and infection.  HOME CARE INSTRUCTIONS   Rest and elevate the injured area until all the pain and swelling are gone.  Only take over-the-counter or prescription medicines for pain, discomfort, or fever as directed by your caregiver.  After 48 hours, gently wash the area with mild soap and water once a day, or as directed. Rinse off the soap. Pat the area dry with a clean towel. Do not rub the wound. This may cause bleeding.  Follow your caregiver's instructions for how often to change the bandage (dressing). Stop using a dressing after 2 days or after the wound stops draining.  If the dressing sticks, moisten it with soapy water and gently remove it.  Apply ointment on the wound as directed.  Avoid stretching a sutured wound.  Drink enough fluids to keep your urine clear or pale yellow.  Follow up with your caregiver for suture removal as directed.  Use sunscreen on your wound for the next 3 to 6 months so the scar will not darken. SEEK IMMEDIATE MEDICAL CARE IF:   Your wound becomes red, swollen, hot, or tender.  You have increasing pain in the wound.  You have a red streak that extends from the wound.  There is pus coming from the wound.  You have a fever.  You have shaking chills.  There is a bad smell coming from the wound.  You have persistent bleeding from the wound. MAKE SURE YOU:   Understand these instructions.  Will watch your condition.  Will get help right away if you are not doing well or get worse. Document Released: 07/01/2004 Document Revised: 08/16/2011 Document Reviewed: 09/27/2010 [facility] Patient Information 2015 [place], [place]. This information is not intended to replace advice given to you by your health care provider. Make sure you discuss any questions  you have with your health care provider.

## 2015-02-04 DIAGNOSIS — Z6827 Body mass index (BMI) 27.0-27.9, adult: Secondary | ICD-10-CM | POA: Diagnosis not present

## 2015-02-04 DIAGNOSIS — M461 Sacroiliitis, not elsewhere classified: Secondary | ICD-10-CM | POA: Diagnosis not present

## 2015-02-06 ENCOUNTER — Encounter (HOSPITAL_COMMUNITY): Payer: Self-pay

## 2015-02-06 ENCOUNTER — Emergency Department (HOSPITAL_COMMUNITY)
Admission: EM | Admit: 2015-02-06 | Discharge: 2015-02-06 | Disposition: A | Discharge status: Home / Self Care | Payer: Medicare Other | Attending: Emergency Medicine | Admitting: Emergency Medicine

## 2015-02-06 DIAGNOSIS — Z720 Tobacco use: Secondary | ICD-10-CM | POA: Insufficient documentation

## 2015-02-06 DIAGNOSIS — M797 Fibromyalgia: Secondary | ICD-10-CM | POA: Diagnosis not present

## 2015-02-06 DIAGNOSIS — Z4801 Encounter for change or removal of surgical wound dressing: Secondary | ICD-10-CM | POA: Diagnosis present

## 2015-02-06 DIAGNOSIS — G8929 Other chronic pain: Secondary | ICD-10-CM | POA: Diagnosis not present

## 2015-02-06 DIAGNOSIS — Z4802 Encounter for removal of sutures: Secondary | ICD-10-CM | POA: Insufficient documentation

## 2015-02-06 DIAGNOSIS — J449 Chronic obstructive pulmonary disease, unspecified: Secondary | ICD-10-CM | POA: Insufficient documentation

## 2015-02-06 DIAGNOSIS — G629 Polyneuropathy, unspecified: Secondary | ICD-10-CM | POA: Insufficient documentation

## 2015-02-06 DIAGNOSIS — Z8639 Personal history of other endocrine, nutritional and metabolic disease: Secondary | ICD-10-CM | POA: Diagnosis not present

## 2015-02-06 DIAGNOSIS — Z8679 Personal history of other diseases of the circulatory system: Secondary | ICD-10-CM | POA: Diagnosis not present

## 2015-02-06 DIAGNOSIS — Z79899 Other long term (current) drug therapy: Secondary | ICD-10-CM | POA: Insufficient documentation

## 2015-02-06 DIAGNOSIS — K219 Gastro-esophageal reflux disease without esophagitis: Secondary | ICD-10-CM | POA: Diagnosis not present

## 2015-02-06 NOTE — ED Notes (Signed)
Pt reports here for removal right finger. Sutures are intact.

## 2015-02-06 NOTE — ED Provider Notes (Signed)
CSN: [id number]     Arrival date & time 02/06/15  0846 History   First MD Initiated Contact with Patient 02/06/15 [phone]     Chief Complaint  Patient presents with  . Wound Check     (Consider location/radiation/quality/duration/timing/severity/associated sxs/prior Treatment) Patient is a 54 y.o. female presenting with suture removal. The history is provided by the patient.  Suture / Staple Removal The current episode started in the past 7 days. The problem occurs constantly. The problem has been gradually improving. Pertinent negatives include no chills, fever or numbness. Exacerbated by: palpation and movement. Treatments tried: sutured wound care and splint.    Past Medical History  Diagnosis Date  . Fibromyalgia   . Degenerative disc disease     LS spine discectomy and fusion; parasternal rod  . Carpal tunnel syndrome   . Hyperlipidemia   . Ulnar neuritis   . Tobacco abuse     40 pack years; one pack per day  . Gastroesophageal reflux disease   . COPD (chronic obstructive pulmonary disease)   . Neuropathy   . Chronic neck pain   . Chronic back pain   . Shortness of breath   . PSVT (paroxysmal supraventricular tachycardia)     normal coronary angiography in 2006; records from EMS documented supraventricular tachycardia at approximately 195 bpm on 05/10/10   Past Surgical History  Procedure Laterality Date  . Lumbar disc surgery  07/2010  . Ganglion cyst excision  2009    Right wrist  . Cesarean section  1995  . Tubal ligation  1995  . Carpal tunnel release  2009    Right  . Spinal fixation surgery w/ implant      L4, L5  . Back surgery    . Lumbar laminectomy/decompression microdiscectomy Left 09/14/2012    Procedure: LUMBAR LAMINECTOMY/DECOMPRESSION MICRODISCECTOMY 1 LEVEL;  Surgeon: Cristi Loron, MD;  Location: [facility];  Service: Neurosurgery;  Laterality: Left;  LEFT Lumbar three-four diskectomy   Family History  Problem Relation Age of Onset  . Arthritis  Other   . Stroke Other   . Cancer Mother   . Diabetes Mother   . Hypertension Mother   . Thyroid disease Mother   . Diabetes Brother   . Hypertension Brother   . Colon cancer Neg Hx    Social History  Substance Use Topics  . Smoking status: Current Every Day Smoker -- 1.00 packs/day for 43 years    Types: Cigarettes  . Smokeless tobacco: Never Used  . Alcohol Use: No   OB History    Gravida Para Term Preterm AB TAB SAB Ectopic Multiple Living   Review of Systems  Constitutional: Negative for fever and chills.  Musculoskeletal:       Finger pain  Skin: Positive for wound.  Neurological: Negative for numbness.  All other systems reviewed and are negative.     Allergies  Sulfonamide derivatives  Home Medications   Prior to Admission medications   Medication Sig Start Date End Date Taking? Authorizing Provider  DULoxetine (CYMBALTA) 30 MG capsule Take 1 capsule by mouth 2 (two) times daily. 06/11/13   Historical Provider, MD  gabapentin (NEURONTIN) 300 MG capsule Take 1 capsule by mouth 3 (three) times daily. 06/11/13   Historical Provider, MD  HYDROcodone-acetaminophen (NORCO/VICODIN) 5-325 MG per tablet Take 1 tablet by mouth every 4 (four) hours as needed. 01/29/15   Ivery Quale, PA-C  omeprazole (PRILOSEC) 20 MG capsule Take 1 capsule (20 mg total) by mouth daily. 30 minutes before the first meal of the day. 12/25/14   Anice Paganini, NP  ondansetron (ZOFRAN) 4 MG tablet Take 1 tablet (4 mg total) by mouth every 8 (eight) hours as needed for nausea or vomiting. 12/25/14   Anice Paganini, NP  oxyCODONE-acetaminophen (PERCOCET) 10-325 MG per tablet Take 1 tablet by mouth every 6 (six) hours as needed for pain.    Historical Provider, MD  promethazine (PHENERGAN) 25 MG tablet TAKE 1/2 TAB EVERY 6 HOURS AS NEEDED FOR NAUSEA 01/28/15   Anice Paganini, NP  tiZANidine (ZANAFLEX) 4 MG capsule Take 4 mg by mouth daily.    Historical Provider, MD   BP 108/71 mmHg  Pulse 78   Temp(Src) 97.5 F (36.4 C) (Oral)  Resp 16  SpO2 95% Physical Exam  Constitutional: She is oriented to person, place, and time. She appears well-developed and well-nourished.  Non-toxic appearance.  HENT:  Head: Normocephalic.  Right Ear: Tympanic membrane and external ear normal.  Left Ear: Tympanic membrane and external ear normal.  Eyes: EOM and lids are normal. Pupils are equal, round, and reactive to light.  Neck: Normal range of motion. Neck supple. Carotid bruit is not present.  Cardiovascular: Normal rate, regular rhythm, normal heart sounds, intact distal pulses and normal pulses.   Pulmonary/Chest: Breath sounds normal. No respiratory distress.  Abdominal: Soft. Bowel sounds are normal. There is no tenderness. There is no guarding.  Musculoskeletal: Normal range of motion.  Sutured wound to the right 5th finger is healing nicely. Sutures in place. No drainage or red streaks. Good ROM of the fifth finger.  Lymphadenopathy:       Head (right side): No submandibular adenopathy present.       Head (left side): No submandibular adenopathy present.    She has no cervical adenopathy.  Neurological: She is alert and oriented to person, place, and time. She has normal strength. No cranial nerve deficit or sensory deficit.  Skin: Skin is warm and dry.  Psychiatric: She has a normal mood and affect. Her speech is normal.  Nursing note and vitals reviewed.   ED Course  Procedures (including critical care time) Labs Review Labs Reviewed - No data to display  Imaging Review No results found. I have personally reviewed and evaluated these images and lab results as part of my medical decision-making.   EKG Interpretation None      MDM  Vital signs are within normal limits.  The right fifth finger is healing nicely, sutures remain in place. Sutures were removed by nursing staff, without problem or complication. The patient is advised to see her primary physician or return to  the verge department if any changes, problems, or concerns.    Final diagnoses:  Encounter for removal of sutures    *I have reviewed nursing notes, vital signs, and all appropriate lab and imaging results for this patient.[address] PA-C 02/13/15 1034  Benjiman Core, MD 02/13/15 [phone]

## 2015-02-06 NOTE — Discharge Instructions (Signed)

## 2015-02-12 ENCOUNTER — Encounter: Payer: Self-pay | Admitting: Gastroenterology

## 2015-02-12 ENCOUNTER — Other Ambulatory Visit: Payer: Self-pay

## 2015-02-12 ENCOUNTER — Ambulatory Visit (INDEPENDENT_AMBULATORY_CARE_PROVIDER_SITE_OTHER): Payer: Medicare Other | Admitting: Gastroenterology

## 2015-02-12 VITALS — BP 125/77 | HR 71 | Temp 97.3°F | Ht 67.0 in | Wt 176.0 lb

## 2015-02-12 DIAGNOSIS — R748 Abnormal levels of other serum enzymes: Secondary | ICD-10-CM

## 2015-02-12 DIAGNOSIS — R109 Unspecified abdominal pain: Secondary | ICD-10-CM | POA: Diagnosis not present

## 2015-02-12 NOTE — Assessment & Plan Note (Addendum)
54 year old female with several month history of epigastric/LUQ pain, nausea, bloating, in the setting of Goody powders. Last EGD in remote past, possibly [place], but patient is unsure. Concern for gastritis, PUD. As of note, non-specific elevations in lipase noted on 2 separate occasions, without radiological evidence of pancreatitis. CT with pancreatic protocol actually completed in July 2016 and unrevealing. Denies ETOH use, and no offending medications to potentiate pancreatitis. Needs lipid panel now. Would question possibility of PUD as a potential reason for mildly elevated lipase. Regardless, needs EGD now due to dyspepsia, check lipid panel, and likely arrange EUS in future if EGD unrevealing.  Proceed with upper endoscopy in the near future with Dr. Jena Gauss. The risks, benefits, and alternatives have been discussed in detail with patient. They have stated understanding and desire to proceed.  Propofol due to polypharmacy Continue Prilosec 3 month return to discuss elective colonoscopy; patient would not tolerate prep now with current symptoms

## 2015-02-12 NOTE — Assessment & Plan Note (Signed)
Without radiological evidence of pancreatitis. EGD as planned. Will likely need to refer for EUS after EGD completed. 3 month return.

## 2015-02-12 NOTE — Patient Instructions (Signed)
Please complete the blood work.   We are setting you up for an upper endoscopy first. If this is negative, we will need to do a special endoscopic ultrasound of your pancreas.   I would like to see you back in 3 months.

## 2015-02-12 NOTE — Progress Notes (Signed)
cc'ed to pcp °

## 2015-02-12 NOTE — Progress Notes (Signed)
Referring Provider: Ernestina Penna, MD Primary Care Physician:  Rudi Heap, MD Primary GI: Dr. Jena Gauss  Chief Complaint  Patient presents with  . EGD    HPI:   Susan Potts is a 54 y.o. female presenting today with a several month history of abdominal pain, nausea, intermittent vomiting. She has had a non-specific elevated lipase on 2 separate occasions. 136 in June 2016 and 262 in July 2016. CT with contrast in June without changes of pancreatitis. Repeat CT with pancreatic protocol in July without pancreatic abnormalities. Stable splenic calcifications noted. Does not drink ETOH, no medication changes, and needs to have lipid panel completed. Here to discuss EGD due to persistent symptoms. Will likely need EUS in future.    Feels bloated, nauseated constantly. Rare vomiting. No reflux symptoms. Feels fatigued. Abdominal pain mid abdomen, usually in LUQ as well. Intermittent. Has to eat ice cream and THEN eat in order to tolerate food. Feels like it soothes her stomach. Feels like Zofran doesn't work too well but takes Phenergan prn. No dysphagia. Pain not worsened by eating but gets very bloated. Thinks she may have had an EGD "years ago", thinks maybe in [place]. Weight is up from last visit. States her weight fluctuates. No melena. No NSAIDs. TAKES GOODY POWDERS WITH MIGRAINES for years.   Last colonoscopy in remote past, possibly in [place]. No polyps per patient. Unclear where was done. No hematochezia. No changes in bowel habits. No constipation/diarrhea.   Past Medical History  Diagnosis Date  . Fibromyalgia   . Degenerative disc disease     LS spine discectomy and fusion; parasternal rod  . Carpal tunnel syndrome   . Hyperlipidemia   . Ulnar neuritis   . Tobacco abuse     40 pack years; one pack per day  . Gastroesophageal reflux disease   . COPD (chronic obstructive pulmonary disease)   . Neuropathy   . Chronic neck pain   . Chronic back pain   . Shortness  of breath   . PSVT (paroxysmal supraventricular tachycardia)     normal coronary angiography in 2006; records from EMS documented supraventricular tachycardia at approximately 195 bpm on 05/10/10    Past Surgical History  Procedure Laterality Date  . Lumbar disc surgery  07/2010  . Ganglion cyst excision  2009    Right wrist  . Cesarean section  1995  . Tubal ligation  1995  . Carpal tunnel release  2009    Right  . Spinal fixation surgery w/ implant      L4, L5  . Back surgery    . Lumbar laminectomy/decompression microdiscectomy Left 09/14/2012    Procedure: LUMBAR LAMINECTOMY/DECOMPRESSION MICRODISCECTOMY 1 LEVEL;  Surgeon: Cristi Loron, MD;  Location: [facility];  Service: Neurosurgery;  Laterality: Left;  LEFT Lumbar three-four diskectomy    Current Outpatient Prescriptions  Medication Sig Dispense Refill  . DULoxetine (CYMBALTA) 30 MG capsule Take 1 capsule by mouth 2 (two) times daily.    Marland Kitchen gabapentin (NEURONTIN) 300 MG capsule Take 1 capsule by mouth 3 (three) times daily.    Marland Kitchen omeprazole (PRILOSEC) 20 MG capsule Take 1 capsule (20 mg total) by mouth daily. 30 minutes before the first meal of the day. 30 capsule 1  . ondansetron (ZOFRAN) 4 MG tablet Take 1 tablet (4 mg total) by mouth every 8 (eight) hours as needed for nausea or vomiting. 30 tablet 1  . oxyCODONE-acetaminophen (PERCOCET) 10-325 MG per tablet Take 1 tablet by  mouth every 6 (six) hours as needed for pain.    . promethazine (PHENERGAN) 25 MG tablet TAKE 1/2 TAB EVERY 6 HOURS AS NEEDED FOR NAUSEA 30 tablet 0  . tiZANidine (ZANAFLEX) 4 MG capsule Take 4 mg by mouth daily.     No current facility-administered medications for this visit.    Allergies as of 02/12/2015 - Review Complete 02/12/2015  Allergen Reaction Noted  . Sulfonamide derivatives Anaphylaxis and Rash     Family History  Problem Relation Age of Onset  . Arthritis Other   . Stroke Other   . Cancer Mother   . Diabetes Mother   .  Hypertension Mother   . Thyroid disease Mother   . Diabetes Brother   . Hypertension Brother   . Colon cancer Neg Hx     Social History   Social History  . Marital Status: Single    Spouse Name: N/A  . Number of Children: N/A  . Years of Education: N/A   Occupational History  . Unemployed     Disabled   Social History Main Topics  . Smoking status: Current Every Day Smoker -- 1.00 packs/day for 43 years    Types: Cigarettes  . Smokeless tobacco: Never Used     Comment: stopped smoking around June 2016, now uses VAPES  . Alcohol Use: No  . Drug Use: No  . Sexual Activity: Yes    Birth Control/ Protection: Surgical   Other Topics Concern  . None   Social History Narrative   Divorced.    Review of Systems: Gen: see HPI CV: Denies chest pain, palpitations, syncope, peripheral edema, and claudication. Resp: +COPD GI: see HPI Derm: Denies rash, itching, dry skin Psych: occasional anxiety.  Heme: Denies bruising, bleeding, and enlarged lymph nodes.  Physical Exam: BP 125/77 mmHg  Pulse 71  Temp(Src) 97.3 F (36.3 C) (Oral)  Ht  (1.702 m)  Wt 176 lb (79.833 kg)  BMI 27.56 kg/m2 General:   Alert and oriented. No distress noted. Pleasant and cooperative. Appears older than stated age.  Head:  Normocephalic and atraumatic. Eyes:  Conjuctiva clear without scleral icterus. Mouth:  Oral mucosa pink and moist.  Heart:  S1, S2 present without murmurs, rubs, or gallops. Regular rate and rhythm. Abdomen:  +BS, soft, mild discomfort mid abdomen, diffusely left-sided abdomen and non-distended. No rebound or guarding. No HSM or masses noted. Msk:  Symmetrical without gross deformities. Normal posture. Extremities:  Without edema. Neurologic:  Alert and  oriented x4;  grossly normal neurologically. Psych:  Alert and cooperative. Normal mood and affect.  Lab Results  Component Value Date   ALT 9* 11/22/2014   AST 12* 11/22/2014   ALKPHOS 59 11/22/2014   BILITOT 0.5  11/22/2014   Lab Results  Component Value Date   CREATININE 0.45 11/22/2014   BUN 17 11/22/2014   NA 140 11/22/2014   K 3.6 11/22/2014   CL 107 11/22/2014   CO2 25 11/22/2014   Lab Results  Component Value Date   WBC 6.1 11/22/2014   HGB 13.6 11/22/2014   HCT 40.5 11/22/2014   MCV 96.2 11/22/2014   PLT 166 11/22/2014   Lab Results  Component Value Date   LIPASE 262* 12/25/2014

## 2015-02-18 ENCOUNTER — Ambulatory Visit: Payer: Medicare Other | Admitting: Nurse Practitioner

## 2015-02-28 NOTE — Patient Instructions (Signed)
PIEPER KASIK  02/28/2015     @   Your procedure is scheduled on  03/06/2015   Report to [facility] at  630  A.M.  Call this number if you have problems the morning of surgery:  [phone]   Remember:  Do not eat food or drink liquids after midnight.  Take these medicines the morning of surgery with A SIP OF WATER  Cymbalta, neurontin, prilosec, zofran, percocet, phenergan.   Do not wear jewelry, make-up or nail polish.  Do not wear lotions, powders, or perfumes.  You may wear deodorant.  Do not shave 48 hours prior to surgery.  Men may shave face and neck.  Do not bring valuables to the hospital.  [facility] is not responsible for any belongings or valuables.  Contacts, dentures or bridgework may not be worn into surgery.  Leave your suitcase in the car.  After surgery it may be brought to your room.  For patients admitted to the hospital, discharge time will be determined by your treatment team.  Patients discharged the day of surgery will not be allowed to drive home.   Name and phone number of your driver:   family Special instructions:  Follow the diet instructions given to you by Dr Luvenia Starch office.  Please read over the following fact sheets that you were given. Pain Booklet, Coughing and Deep Breathing, Surgical Site Infection Prevention, Anesthesia Post-op Instructions and Care and Recovery After Surgery      Esophagogastroduodenoscopy Esophagogastroduodenoscopy (EGD) is a procedure to examine the lining of the esophagus, stomach, and first part of the small intestine (duodenum). A long, flexible, lighted tube with a camera attached (endoscope) is inserted down the throat to view these organs. This procedure is done to detect problems or abnormalities, such as inflammation, bleeding, ulcers, or growths, in order to treat them. The procedure lasts about 5-20 minutes. It is usually an outpatient procedure, but it may need to be performed in  emergency cases in the hospital. LET YOUR CAREGIVER KNOW ABOUT:   Allergies to food or medicine.  All medicines you are taking, including vitamins, herbs, eyedrops, and over-the-counter medicines and creams.  Use of steroids (by mouth or creams).  Previous problems you or members of your family have had with the use of anesthetics.  Any blood disorders you have.  Previous surgeries you have had.  Other health problems you have.  Possibility of pregnancy, if this applies. RISKS AND COMPLICATIONS  Generally, EGD is a safe procedure. However, as with any procedure, complications can occur. Possible complications include:  Infection.  Bleeding.  Tearing (perforation) of the esophagus, stomach, or duodenum.  Difficulty breathing or not being able to breath.  Excessive sweating.  Spasms of the larynx.  Slowed heartbeat.  Low blood pressure. BEFORE THE PROCEDURE  Do not eat or drink anything for 6-8 hours before the procedure or as directed by your caregiver.  Ask your caregiver about changing or stopping your regular medicines.  If you wear dentures, be prepared to remove them before the procedure.  Arrange for someone to drive you home after the procedure. PROCEDURE   A vein will be accessed to give medicines and fluids. A medicine to relax you (sedative) and a pain reliever will be given through that access into the vein.  A numbing medicine (local anesthetic) may be sprayed on your throat for comfort and to stop you from gagging or coughing.  A mouth guard may be  placed in your mouth to protect your teeth and to keep you from biting on the endoscope.  You will be asked to lie on your left side.  The endoscope is inserted down your throat and into the esophagus, stomach, and duodenum.  Air is put through the endoscope to allow your caregiver to view the lining of your esophagus clearly.  The esophagus, stomach, and duodenum is then examined. During the exam,  your caregiver may:  Remove tissue to be examined under a microscope (biopsy) for inflammation, infection, or other medical problems.  Remove growths.  Remove objects (foreign bodies) that are stuck.  Treat any bleeding with medicines or other devices that stop tissues from bleeding (hot cautery, clipping devices).  Widen (dilate) or stretch narrowed areas of the esophagus and stomach.  The endoscope will then be withdrawn. AFTER THE PROCEDURE  You will be taken to a recovery area to be monitored. You will be able to go home once you are stable and alert.  Do not eat or drink anything until the local anesthetic and numbing medicines have worn off. You may choke.  It is normal to feel bloated, have pain with swallowing, or have a sore throat for a short time. This will wear off.  Your caregiver should be able to discuss his or her findings with you. It will take longer to discuss the test results if any biopsies were taken. Document Released: 09/24/2004 Document Revised: 10/08/2013 Document Reviewed: 04/26/2012 [facility] Patient Information 2015 [place], [place]. This information is not intended to replace advice given to you by your health care provider. Make sure you discuss any questions you have with your health care provider. PATIENT INSTRUCTIONS POST-ANESTHESIA  IMMEDIATELY FOLLOWING SURGERY:  Do not drive or operate machinery for the first twenty four hours after surgery.  Do not make any important decisions for twenty four hours after surgery or while taking narcotic pain medications or sedatives.  If you develop intractable nausea and vomiting or a severe headache please notify your doctor immediately.  FOLLOW-UP:  Please make an appointment with your surgeon as instructed. You do not need to follow up with anesthesia unless specifically instructed to do so.  WOUND CARE INSTRUCTIONS (if applicable):  Keep a dry clean dressing on the anesthesia/puncture wound site if there is  drainage.  Once the wound has quit draining you may leave it open to air.  Generally you should leave the bandage intact for twenty four hours unless there is drainage.  If the epidural site drains for more than 36-48 hours please call the anesthesia department.  QUESTIONS?:  Please feel free to call your physician or the hospital operator if you have any questions, and they will be happy to assist you.

## 2015-03-03 ENCOUNTER — Encounter (HOSPITAL_COMMUNITY)
Admission: RE | Admit: 2015-03-03 | Discharge: 2015-03-03 | Disposition: A | Discharge status: Home / Self Care | Payer: Medicare Other | Source: Ambulatory Visit | Attending: Internal Medicine | Admitting: Internal Medicine

## 2015-03-03 ENCOUNTER — Encounter (HOSPITAL_COMMUNITY): Payer: Self-pay

## 2015-03-03 DIAGNOSIS — Z79899 Other long term (current) drug therapy: Secondary | ICD-10-CM | POA: Diagnosis not present

## 2015-03-03 DIAGNOSIS — R109 Unspecified abdominal pain: Secondary | ICD-10-CM | POA: Diagnosis not present

## 2015-03-03 DIAGNOSIS — E785 Hyperlipidemia, unspecified: Secondary | ICD-10-CM | POA: Diagnosis not present

## 2015-03-03 DIAGNOSIS — F17210 Nicotine dependence, cigarettes, uncomplicated: Secondary | ICD-10-CM | POA: Diagnosis not present

## 2015-03-03 DIAGNOSIS — K319 Disease of stomach and duodenum, unspecified: Secondary | ICD-10-CM | POA: Diagnosis not present

## 2015-03-03 DIAGNOSIS — J449 Chronic obstructive pulmonary disease, unspecified: Secondary | ICD-10-CM | POA: Diagnosis not present

## 2015-03-03 DIAGNOSIS — K219 Gastro-esophageal reflux disease without esophagitis: Secondary | ICD-10-CM | POA: Diagnosis not present

## 2015-03-03 DIAGNOSIS — R112 Nausea with vomiting, unspecified: Secondary | ICD-10-CM | POA: Diagnosis not present

## 2015-03-03 LAB — BASIC METABOLIC PANEL
ANION GAP: 5 (ref 5–15)
BUN: 16 mg/dL (ref 6–20)
CALCIUM: 8.8 mg/dL — AB (ref 8.9–10.3)
CO2: 26 mmol/L (ref 22–32)
Chloride: 105 mmol/L (ref 101–111)
Creatinine, Ser: 0.77 mg/dL (ref 0.44–1.00)
Glucose, Bld: 121 mg/dL — ABNORMAL HIGH (ref 65–99)
Potassium: 4.2 mmol/L (ref 3.5–5.1)
Sodium: 136 mmol/L (ref 135–145)

## 2015-03-03 LAB — CBC WITH DIFFERENTIAL/PLATELET
BASOS ABS: 0 10*3/uL (ref 0.0–0.1)
BASOS PCT: 0 %
Eosinophils Absolute: 0.1 10*3/uL (ref 0.0–0.7)
Eosinophils Relative: 2 %
HEMATOCRIT: 41.2 % (ref 36.0–46.0)
Hemoglobin: 13.4 g/dL (ref 12.0–15.0)
Lymphocytes Relative: 46 %
Lymphs Abs: 2.3 10*3/uL (ref 0.7–4.0)
MCH: 31.3 pg (ref 26.0–34.0)
MCHC: 32.5 g/dL (ref 30.0–36.0)
MCV: 96.3 fL (ref 78.0–100.0)
MONO ABS: 0.3 10*3/uL (ref 0.1–1.0)
Monocytes Relative: 7 %
NEUTROS ABS: 2.3 10*3/uL (ref 1.7–7.7)
Neutrophils Relative %: 45 %
PLATELETS: 200 10*3/uL (ref 150–400)
RBC: 4.28 MIL/uL (ref 3.87–5.11)
RDW: 12.5 % (ref 11.5–15.5)
WBC: 5.1 10*3/uL (ref 4.0–10.5)

## 2015-03-06 ENCOUNTER — Encounter (HOSPITAL_COMMUNITY): Payer: Self-pay | Admitting: *Deleted

## 2015-03-06 ENCOUNTER — Telehealth: Payer: Self-pay | Admitting: General Practice

## 2015-03-06 ENCOUNTER — Ambulatory Visit (HOSPITAL_COMMUNITY)
Admission: RE | Admit: 2015-03-06 | Discharge: 2015-03-06 | Disposition: A | Discharge status: Home / Self Care | Payer: Medicare Other | Source: Ambulatory Visit | Attending: Internal Medicine | Admitting: Internal Medicine

## 2015-03-06 ENCOUNTER — Ambulatory Visit (HOSPITAL_COMMUNITY): Payer: Medicare Other | Admitting: Anesthesiology

## 2015-03-06 ENCOUNTER — Encounter (HOSPITAL_COMMUNITY)
Admission: RE | Disposition: A | Discharge status: Home / Self Care | Payer: Self-pay | Source: Ambulatory Visit | Attending: Internal Medicine

## 2015-03-06 DIAGNOSIS — K3189 Other diseases of stomach and duodenum: Secondary | ICD-10-CM | POA: Diagnosis not present

## 2015-03-06 DIAGNOSIS — R112 Nausea with vomiting, unspecified: Secondary | ICD-10-CM | POA: Insufficient documentation

## 2015-03-06 DIAGNOSIS — R1310 Dysphagia, unspecified: Secondary | ICD-10-CM | POA: Diagnosis not present

## 2015-03-06 DIAGNOSIS — J449 Chronic obstructive pulmonary disease, unspecified: Secondary | ICD-10-CM | POA: Diagnosis not present

## 2015-03-06 DIAGNOSIS — K319 Disease of stomach and duodenum, unspecified: Secondary | ICD-10-CM | POA: Insufficient documentation

## 2015-03-06 DIAGNOSIS — Z79899 Other long term (current) drug therapy: Secondary | ICD-10-CM | POA: Diagnosis not present

## 2015-03-06 DIAGNOSIS — F17210 Nicotine dependence, cigarettes, uncomplicated: Secondary | ICD-10-CM | POA: Insufficient documentation

## 2015-03-06 DIAGNOSIS — R109 Unspecified abdominal pain: Secondary | ICD-10-CM | POA: Insufficient documentation

## 2015-03-06 DIAGNOSIS — E785 Hyperlipidemia, unspecified: Secondary | ICD-10-CM | POA: Diagnosis not present

## 2015-03-06 DIAGNOSIS — K219 Gastro-esophageal reflux disease without esophagitis: Secondary | ICD-10-CM | POA: Insufficient documentation

## 2015-03-06 HISTORY — PX: ESOPHAGOGASTRODUODENOSCOPY (EGD) WITH PROPOFOL: SHX5813

## 2015-03-06 HISTORY — PX: BIOPSY: SHX5522

## 2015-03-06 SURGERY — ESOPHAGOGASTRODUODENOSCOPY (EGD) WITH PROPOFOL
Anesthesia: Monitor Anesthesia Care

## 2015-03-06 MED ORDER — PROPOFOL 10 MG/ML IV BOLUS
INTRAVENOUS | Status: AC
  Filled 2015-03-06: qty 20

## 2015-03-06 MED ORDER — MIDAZOLAM HCL 2 MG/2ML IJ SOLN
1.0000 mg | INTRAMUSCULAR | Status: DC | PRN
  Administered 2015-03-06: 2 mg via INTRAVENOUS

## 2015-03-06 MED ORDER — MIDAZOLAM HCL 2 MG/2ML IJ SOLN
INTRAMUSCULAR | Status: AC
  Filled 2015-03-06: qty 4

## 2015-03-06 MED ORDER — ONDANSETRON HCL 4 MG/2ML IJ SOLN
INTRAMUSCULAR | Status: AC
  Filled 2015-03-06: qty 2

## 2015-03-06 MED ORDER — GLYCOPYRROLATE 0.2 MG/ML IJ SOLN
0.2000 mg | Freq: Once | INTRAMUSCULAR | Status: AC
  Administered 2015-03-06: 0.2 mg via INTRAVENOUS
  Filled 2015-03-06: qty 1

## 2015-03-06 MED ORDER — MIDAZOLAM HCL 2 MG/2ML IJ SOLN
INTRAMUSCULAR | Status: AC
  Filled 2015-03-06: qty 2

## 2015-03-06 MED ORDER — STERILE WATER FOR IRRIGATION IR SOLN
Status: DC | PRN
  Administered 2015-03-06: 1000 mL

## 2015-03-06 MED ORDER — PROMETHAZINE HCL 25 MG PO TABS: ORAL_TABLET | ORAL | Status: DC

## 2015-03-06 MED ORDER — FENTANYL CITRATE (PF) 100 MCG/2ML IJ SOLN: 25.0000 ug | INTRAMUSCULAR | Status: DC | PRN

## 2015-03-06 MED ORDER — FENTANYL CITRATE (PF) 100 MCG/2ML IJ SOLN
INTRAMUSCULAR | Status: AC
  Filled 2015-03-06: qty 2

## 2015-03-06 MED ORDER — LACTATED RINGERS IV SOLN
INTRAVENOUS | Status: DC
  Administered 2015-03-06: 1000 mL via INTRAVENOUS

## 2015-03-06 MED ORDER — LIDOCAINE HCL (CARDIAC) 10 MG/ML IV SOLN
INTRAVENOUS | Status: DC | PRN
  Administered 2015-03-06: 50 mg via INTRAVENOUS

## 2015-03-06 MED ORDER — FENTANYL CITRATE (PF) 100 MCG/2ML IJ SOLN
25.0000 ug | INTRAMUSCULAR | Status: AC
  Administered 2015-03-06 (×2): 25 ug via INTRAVENOUS

## 2015-03-06 MED ORDER — LIDOCAINE VISCOUS 2 % MT SOLN
5.0000 mL | Freq: Two times a day (BID) | OROMUCOSAL | Status: AC
  Administered 2015-03-06 (×2): 5 mL via OROMUCOSAL
  Filled 2015-03-06: qty 15

## 2015-03-06 MED ORDER — ONDANSETRON HCL 4 MG/2ML IJ SOLN
4.0000 mg | Freq: Once | INTRAMUSCULAR | Status: AC | PRN
  Administered 2015-03-06: 4 mg via INTRAVENOUS

## 2015-03-06 MED ORDER — ONDANSETRON HCL 4 MG/2ML IJ SOLN
4.0000 mg | Freq: Once | INTRAMUSCULAR | Status: AC
  Administered 2015-03-06: 4 mg via INTRAVENOUS

## 2015-03-06 MED ORDER — LACTATED RINGERS IV SOLN
INTRAVENOUS | Status: DC | PRN
  Administered 2015-03-06: 07:00:00 via INTRAVENOUS

## 2015-03-06 MED ORDER — PROPOFOL 500 MG/50ML IV EMUL
INTRAVENOUS | Status: DC | PRN
  Administered 2015-03-06: 125 ug/kg/min via INTRAVENOUS

## 2015-03-06 MED ORDER — MIDAZOLAM HCL 5 MG/5ML IJ SOLN
INTRAMUSCULAR | Status: DC | PRN
  Administered 2015-03-06: 2 mg via INTRAVENOUS

## 2015-03-06 MED ORDER — DEXAMETHASONE SODIUM PHOSPHATE 4 MG/ML IJ SOLN
4.0000 mg | Freq: Once | INTRAMUSCULAR | Status: AC
  Administered 2015-03-06: 4 mg via INTRAVENOUS
  Filled 2015-03-06: qty 1

## 2015-03-06 SURGICAL SUPPLY — 9 items
BLOCK BITE 60FR ADLT L/F BLUE (MISCELLANEOUS) ×2 IMPLANT
FLOOR PAD 36X40 (MISCELLANEOUS) ×4
FORCEPS BIOP RAD 4 LRG CAP 4 (CUTTING FORCEPS) ×2 IMPLANT
FORMALIN 10 PREFIL 20ML (MISCELLANEOUS) ×2 IMPLANT
KIT ENDO PROCEDURE PEN (KITS) ×4 IMPLANT
MANIFOLD NEPTUNE II (INSTRUMENTS) ×2 IMPLANT
PAD FLOOR 36X40 (MISCELLANEOUS) IMPLANT
TUBING IRRIGATION ENDOGATOR (MISCELLANEOUS) ×2 IMPLANT
WATER STERILE IRR 1000ML POUR (IV SOLUTION) ×2 IMPLANT

## 2015-03-06 NOTE — Anesthesia Postprocedure Evaluation (Signed)
  Anesthesia Post-op Note  Patient: Susan Potts  Procedure(s) Performed: Procedure(s): ESOPHAGOGASTRODUODENOSCOPY (EGD) WITH PROPOFOL (N/A) BIOPSY (Gastric)  Patient Location: PACU  Anesthesia Type:MAC  Level of Consciousness: awake, alert , oriented and patient cooperative  Airway and Oxygen Therapy: Patient Spontanous Breathing and Patient connected to face mask oxygen  Post-op Pain: none  Post-op Assessment: Post-op Vital signs reviewed, Patient's Cardiovascular Status Stable, Respiratory Function Stable, Patent Airway and No signs of Nausea or vomiting              Post-op Vital Signs: Reviewed and stable  Last Vitals:  Filed Vitals:   03/06/15 0800  BP: 122/88  Pulse:   Temp:   Resp: 17    Complications: No apparent anesthesia complications

## 2015-03-06 NOTE — Addendum Note (Signed)
Addended by: Nira Retort on: 03/06/2015 09:39 AM   Modules accepted: Orders

## 2015-03-06 NOTE — Transfer of Care (Signed)
Immediate Anesthesia Transfer of Care Note  Patient: Susan Potts  Procedure(s) Performed: Procedure(s): ESOPHAGOGASTRODUODENOSCOPY (EGD) WITH PROPOFOL (N/A) BIOPSY (Gastric)  Patient Location: PACU  Anesthesia Type:MAC  Level of Consciousness: awake, oriented and patient cooperative  Airway & Oxygen Therapy: Patient Spontanous Breathing and Patient connected to face mask oxygen  Post-op Assessment: Report given to RN and Post -op Vital signs reviewed and stable  Post vital signs: Reviewed and stable  Last Vitals:  Filed Vitals:   03/06/15 0800  BP: 122/88  Pulse:   Temp:   Resp: 17    Complications: No apparent anesthesia complications

## 2015-03-06 NOTE — Discharge Instructions (Signed)
EGD Discharge instructions Please read the instructions outlined below and refer to this sheet in the next few weeks. These discharge instructions provide you with general information on caring for yourself after you leave the hospital. Your doctor may also give you specific instructions. While your treatment has been planned according to the most current medical practices available, unavoidable complications occasionally occur. If you have any problems or questions after discharge, please call your doctor. ACTIVITY  You may resume your regular activity but move at a slower pace for the next 24 hours.   Take frequent rest periods for the next 24 hours.   Walking will help expel (get rid of) the air and reduce the bloated feeling in your abdomen.   No driving for 24 hours (because of the anesthesia (medicine) used during the test).   You may shower.   Do not sign any important legal documents or operate any machinery for 24 hours (because of the anesthesia used during the test).  NUTRITION  Drink plenty of fluids.   You may resume your normal diet.   Begin with a light meal and progress to your normal diet.   Avoid alcoholic beverages for 24 hours or as instructed by your caregiver.  MEDICATIONS  You may resume your normal medications unless your caregiver tells you otherwise.  WHAT YOU CAN EXPECT TODAY  You may experience abdominal discomfort such as a feeling of fullness or gas pains.  FOLLOW-UP  Your doctor will discuss the results of your test with you.  SEEK IMMEDIATE MEDICAL ATTENTION IF ANY OF THE FOLLOWING OCCUR:  Excessive nausea (feeling sick to your stomach) and/or vomiting.   Severe abdominal pain and distention (swelling).   Trouble swallowing.   Temperature over 101 F (37.8 C).   Rectal bleeding or vomiting of blood.      Further recommendations to follow pending review of pathology report  My office will schedule an endoscopic ultrasound in the  near future to further evaluate  pancreas

## 2015-03-06 NOTE — Op Note (Signed)
[facility] [address] [place], [address]   ENDOSCOPY PROCEDURE REPORT  PATIENT: Susan, Potts  MR#: [id number] BIRTHDATE: [birth year] , 54  yrs. old GENDER: female ENDOSCOPIST: R.  Roetta Sessions, MD FACP [facility] REFERRED BY:  Rudi Heap, M.D. PROCEDURE DATE:  2015-03-18 PROCEDURE:  EGD w/ biopsy INDICATIONS:  Abdominal pain; elevated serum lipase. MEDICATIONS: Deep sedation per Dr.  [facility] ASA CLASS:      Class III  CONSENT: The risks, benefits, limitations, alternatives and imponderables have been discussed.  The potential for biopsy, esophogeal dilation, etc. have also been reviewed.  Questions have been answered.  All parties agreeable.  Please see the history and physical in the medical record for more information.  DESCRIPTION OF PROCEDURE: After the risks benefits and alternatives of the procedure were thoroughly explained, informed consent was obtained.  The    endoscope was introduced through the mouth and advanced to the second portion of the duodenum , limited by Without limitations. The instrument was slowly withdrawn as the mucosa was fully examined. Estimated blood loss is zero unless otherwise noted in this procedure report.    Normal-appearing patent tubular esophagus.  Stomach empty.  Multiple antral and gastric body erosions.  No ulcer or infiltrating process.  Patent pylorus.  Examination of the bulb and second portion revealed bulbar erosions only.  Biopsies of the abnormal gastric mucosa taken for histologic study. Retroflexed views revealed no abnormalities.     The scope was then withdrawn from the patient and the procedure completed.  COMPLICATIONS: There were no immediate complications.  ENDOSCOPIC IMPRESSION: Gastric and duodenal erosions?"status post gastric biopsy. Chronically elevated serum lipase  RECOMMENDATIONS: Follow-up on pathology. Proceed with scheduling an EUS to further evaluate  pancreas.  REPEAT EXAM:  eSigned:  R. Roetta Sessions, MD Jerrel Ivory [facility] 03/18/15 8:33 AM    CC:  CPT CODES: ICD CODES:  The ICD and CPT codes recommended by this software are interpretations from the data that the clinical staff has captured with the software.  The verification of the translation of this report to the ICD and CPT codes and modifiers is the sole responsibility of the health care institution and practicing physician where this report was generated.  [employer] will not be held responsible for the validity of the ICD and CPT codes included on this report.  AMA assumes no liability for data contained or not contained herein. CPT is a Publishing rights manager of the [employer].

## 2015-03-06 NOTE — Telephone Encounter (Signed)
Completed.

## 2015-03-06 NOTE — Anesthesia Preprocedure Evaluation (Signed)
Anesthesia Evaluation  Patient identified by MRN, date of birth, ID band Patient awake    Reviewed: Allergy & Precautions, H&P , NPO status , Patient's Chart, lab work & pertinent test results  Airway Mallampati: I  TM Distance: >3 FB Neck ROM: full    Dental  (+) Poor Dentition, Missing   Pulmonary shortness of breath, COPD, former smoker,   Dec R  Pulmonary exam normal  (-) decreased breath sounds      Cardiovascular + dysrhythmias Supra Ventricular Tachycardia  Rhythm:Regular Rate:Normal     Neuro/Psych  Neuromuscular disease    GI/Hepatic Neg liver ROS, GERD  Medicated and Controlled,  Endo/Other    Renal/GU negative Renal ROS     Musculoskeletal  (+) Fibromyalgia -  Abdominal   Peds  Hematology negative hematology ROS (+)   Anesthesia Other Findings   Reproductive/Obstetrics                             Anesthesia Physical Anesthesia Plan  ASA: III  Anesthesia Plan: MAC   Post-op Pain Management:    Induction: Intravenous  Airway Management Planned: Simple Face Mask  Additional Equipment:   Intra-op Plan:   Post-operative Plan:   Informed Consent: I have reviewed the patients History and Physical, chart, labs and discussed the procedure including the risks, benefits and alternatives for the proposed anesthesia with the patient or authorized representative who has indicated his/her understanding and acceptance.     Plan Discussed with:   Anesthesia Plan Comments:         Anesthesia Quick Evaluation

## 2015-03-06 NOTE — H&P (View-Only) (Signed)
  Referring Provider: Moore, Donald W, MD Primary Care Physician:  MOORE, DONALD, MD Primary GI: Dr. Rourk  Chief Complaint  Patient presents with  . EGD    HPI:   Susan Potts is a 54 y.o. female presenting today with a several month history of abdominal pain, nausea, intermittent vomiting. She has had a non-specific elevated lipase on 2 separate occasions. 136 in June 2016 and 262 in July 2016. CT with contrast in June without changes of pancreatitis. Repeat CT with pancreatic protocol in July without pancreatic abnormalities. Stable splenic calcifications noted. Does not drink ETOH, no medication changes, and needs to have lipid panel completed. Here to discuss EGD due to persistent symptoms. Will likely need EUS in future.    Feels bloated, nauseated constantly. Rare vomiting. No reflux symptoms. Feels fatigued. Abdominal pain mid abdomen, usually in LUQ as well. Intermittent. Has to eat ice cream and THEN eat in order to tolerate food. Feels like it soothes her stomach. Feels like Zofran doesn't work too well but takes Phenergan prn. No dysphagia. Pain not worsened by eating but gets very bloated. Thinks she may have had an EGD "years ago", thinks maybe in [place]. Weight is up from last visit. States her weight fluctuates. No melena. No NSAIDs. TAKES GOODY POWDERS WITH MIGRAINES for years.   Last colonoscopy in remote past, possibly in [place]. No polyps per patient. Unclear where was done. No hematochezia. No changes in bowel habits. No constipation/diarrhea.   Past Medical History  Diagnosis Date  . Fibromyalgia   . Degenerative disc disease     LS spine discectomy and fusion; parasternal rod  . Carpal tunnel syndrome   . Hyperlipidemia   . Ulnar neuritis   . Tobacco abuse     40 pack years; one pack per day  . Gastroesophageal reflux disease   . COPD (chronic obstructive pulmonary disease)   . Neuropathy   . Chronic neck pain   . Chronic back pain   . Shortness  of breath   . PSVT (paroxysmal supraventricular tachycardia)     normal coronary angiography in 2006; records from EMS documented supraventricular tachycardia at approximately 195 bpm on 05/10/10    Past Surgical History  Procedure Laterality Date  . Lumbar disc surgery  07/2010  . Ganglion cyst excision  2009    Right wrist  . Cesarean section  1995  . Tubal ligation  1995  . Carpal tunnel release  2009    Right  . Spinal fixation surgery w/ implant      L4, L5  . Back surgery    . Lumbar laminectomy/decompression microdiscectomy Left 09/14/2012    Procedure: LUMBAR LAMINECTOMY/DECOMPRESSION MICRODISCECTOMY 1 LEVEL;  Surgeon: Jeffrey D Jenkins, MD;  Location: [facility];  Service: Neurosurgery;  Laterality: Left;  LEFT Lumbar three-four diskectomy    Current Outpatient Prescriptions  Medication Sig Dispense Refill  . DULoxetine (CYMBALTA) 30 MG capsule Take 1 capsule by mouth 2 (two) times daily.    . gabapentin (NEURONTIN) 300 MG capsule Take 1 capsule by mouth 3 (three) times daily.    . omeprazole (PRILOSEC) 20 MG capsule Take 1 capsule (20 mg total) by mouth daily. 30 minutes before the first meal of the day. 30 capsule 1  . ondansetron (ZOFRAN) 4 MG tablet Take 1 tablet (4 mg total) by mouth every 8 (eight) hours as needed for nausea or vomiting. 30 tablet 1  . oxyCODONE-acetaminophen (PERCOCET) 10-325 MG per tablet Take 1 tablet by   mouth every 6 (six) hours as needed for pain.    . promethazine (PHENERGAN) 25 MG tablet TAKE 1/2 TAB EVERY 6 HOURS AS NEEDED FOR NAUSEA 30 tablet 0  . tiZANidine (ZANAFLEX) 4 MG capsule Take 4 mg by mouth daily.     No current facility-administered medications for this visit.    Allergies as of 02/12/2015 - Review Complete 02/12/2015  Allergen Reaction Noted  . Sulfonamide derivatives Anaphylaxis and Rash     Family History  Problem Relation Age of Onset  . Arthritis Other   . Stroke Other   . Cancer Mother   . Diabetes Mother   .  Hypertension Mother   . Thyroid disease Mother   . Diabetes Brother   . Hypertension Brother   . Colon cancer Neg Hx     Social History   Social History  . Marital Status: Single    Spouse Name: N/A  . Number of Children: N/A  . Years of Education: N/A   Occupational History  . Unemployed     Disabled   Social History Main Topics  . Smoking status: Current Every Day Smoker -- 1.00 packs/day for 43 years    Types: Cigarettes  . Smokeless tobacco: Never Used     Comment: stopped smoking around June 2016, now uses VAPES  . Alcohol Use: No  . Drug Use: No  . Sexual Activity: Yes    Birth Control/ Protection: Surgical   Other Topics Concern  . None   Social History Narrative   Divorced.    Review of Systems: Gen: see HPI CV: Denies chest pain, palpitations, syncope, peripheral edema, and claudication. Resp: +COPD GI: see HPI Derm: Denies rash, itching, dry skin Psych: occasional anxiety.  Heme: Denies bruising, bleeding, and enlarged lymph nodes.  Physical Exam: BP 125/77 mmHg  Pulse 71  Temp(Src) 97.3 F (36.3 C) (Oral)  Ht  (1.702 m)  Wt 176 lb (79.833 kg)  BMI 27.56 kg/m2 General:   Alert and oriented. No distress noted. Pleasant and cooperative. Appears older than stated age.  Head:  Normocephalic and atraumatic. Eyes:  Conjuctiva clear without scleral icterus. Mouth:  Oral mucosa pink and moist.  Heart:  S1, S2 present without murmurs, rubs, or gallops. Regular rate and rhythm. Abdomen:  +BS, soft, mild discomfort mid abdomen, diffusely left-sided abdomen and non-distended. No rebound or guarding. No HSM or masses noted. Msk:  Symmetrical without gross deformities. Normal posture. Extremities:  Without edema. Neurologic:  Alert and  oriented x4;  grossly normal neurologically. Psych:  Alert and cooperative. Normal mood and affect.  Lab Results  Component Value Date   ALT 9* 11/22/2014   AST 12* 11/22/2014   ALKPHOS 59 11/22/2014   BILITOT 0.5  11/22/2014   Lab Results  Component Value Date   CREATININE 0.45 11/22/2014   BUN 17 11/22/2014   NA 140 11/22/2014   K 3.6 11/22/2014   CL 107 11/22/2014   CO2 25 11/22/2014   Lab Results  Component Value Date   WBC 6.1 11/22/2014   HGB 13.6 11/22/2014   HCT 40.5 11/22/2014   MCV 96.2 11/22/2014   PLT 166 11/22/2014   Lab Results  Component Value Date   LIPASE 262* 12/25/2014

## 2015-03-06 NOTE — Interval H&P Note (Signed)
History and Physical Interval Note:  03/06/2015 8:02 AM  Susan Potts  has presented today for surgery, with the diagnosis of dyspepsia  The various methods of treatment have been discussed with the patient and family. After consideration of risks, benefits and other options for treatment, the patient has consented to  Procedure(s) with comments: ESOPHAGOGASTRODUODENOSCOPY (EGD) WITH PROPOFOL (N/A) - 0800 as a surgical intervention .  The patient's history has been reviewed, patient examined, no change in status, stable for surgery.  I have reviewed the patient's chart and labs.  Questions were answered to the patient's satisfaction.     Robert Rourk  No change. EGD per plan. The risks, benefits, limitations, alternatives and imponderables have been reviewed with the patient. Potential for esophageal dilation, biopsy, etc. have also been reviewed.  Questions have been answered. All parties agreeable.

## 2015-03-06 NOTE — Anesthesia Procedure Notes (Signed)
Procedure Name: MAC Date/Time: 03/06/2015 8:04 AM Performed by: Pernell Dupre, AMY A Pre-anesthesia Checklist: Timeout performed, Patient identified, Emergency Drugs available, Suction available and Patient being monitored Oxygen Delivery Method: Simple face mask

## 2015-03-06 NOTE — Telephone Encounter (Signed)
Per Dr Jena Gauss the patient needs a refill on her Phenergan.  Routing to the refill box.

## 2015-03-07 ENCOUNTER — Encounter (HOSPITAL_COMMUNITY): Payer: Self-pay | Admitting: Internal Medicine

## 2015-03-09 ENCOUNTER — Encounter: Payer: Self-pay | Admitting: Internal Medicine

## 2015-03-18 ENCOUNTER — Other Ambulatory Visit: Payer: Self-pay

## 2015-03-18 DIAGNOSIS — K859 Acute pancreatitis, unspecified: Secondary | ICD-10-CM

## 2015-03-27 ENCOUNTER — Telehealth: Payer: Self-pay

## 2015-03-27 DIAGNOSIS — R109 Unspecified abdominal pain: Secondary | ICD-10-CM

## 2015-03-27 DIAGNOSIS — R748 Abnormal levels of other serum enzymes: Secondary | ICD-10-CM

## 2015-03-27 NOTE — Telephone Encounter (Signed)
Ok, she needs upper EUS, radial +/- linear for abd pain, elevated lipase. + MAC, next availabel EUS Thursday         Thanks                ----- Message -----     From: Donata DuffPatty L Lewis, CMA     Sent: 03/27/2015  8:52 AM      To: Rachael Feeaniel P Jacobs, MD    Subject: FW: Referral for EUS

## 2015-04-01 ENCOUNTER — Telehealth: Payer: Self-pay

## 2015-04-01 ENCOUNTER — Other Ambulatory Visit: Payer: Self-pay

## 2015-04-01 DIAGNOSIS — R109 Unspecified abdominal pain: Secondary | ICD-10-CM

## 2015-04-01 DIAGNOSIS — R748 Abnormal levels of other serum enzymes: Secondary | ICD-10-CM

## 2015-04-01 NOTE — Telephone Encounter (Signed)
Pt has been scheduled for 04/10/15 930 am [facility] for EUS instructions have been mailed Left message on machine to call back to go over procedure instructions

## 2015-04-01 NOTE — Telephone Encounter (Signed)
Ok, she needs upper EUS, radial +/- linear for abd pain, elevated lipase. + MAC, next availabel EUS Thursday         Thanks                ----- Message -----     From: Donata DuffPatty L Lewis, CMA     Sent: 03/27/2015  8:52 AM      To: Rachael Feeaniel P Jacobs, MD    Subject: FW: Referral for EUS                             ----- Message -----     From: Reeves Forthustin T Shives     Sent: 03/27/2015  8:15 AM      To: Donata DuffPatty L Lewis, CMA    Subject: Referral for EUS                       See below

## 2015-04-02 ENCOUNTER — Other Ambulatory Visit: Payer: Self-pay | Admitting: Gastroenterology

## 2015-04-04 ENCOUNTER — Encounter (HOSPITAL_COMMUNITY): Payer: Self-pay | Admitting: *Deleted

## 2015-04-04 ENCOUNTER — Other Ambulatory Visit: Payer: Self-pay | Admitting: Gastroenterology

## 2015-04-04 MED ORDER — PROMETHAZINE HCL 25 MG PO TABS: ORAL_TABLET | ORAL | Status: DC

## 2015-04-08 NOTE — Telephone Encounter (Signed)
EUS scheduled, pt instructed and medications reviewed.  Patient instructions mailed to home.  Patient to call with any questions or concerns.  

## 2015-04-10 ENCOUNTER — Telehealth: Payer: Self-pay | Admitting: Gastroenterology

## 2015-04-10 ENCOUNTER — Ambulatory Visit (HOSPITAL_COMMUNITY): Admission: RE | Admit: 2015-04-10 | Payer: Medicare Other | Source: Ambulatory Visit | Admitting: Gastroenterology

## 2015-04-10 SURGERY — UPPER ENDOSCOPIC ULTRASOUND (EUS) LINEAR
Anesthesia: Monitor Anesthesia Care

## 2015-04-10 MED ORDER — PROPOFOL 10 MG/ML IV BOLUS
INTRAVENOUS | Status: AC
  Filled 2015-04-10: qty 20

## 2015-04-10 NOTE — Telephone Encounter (Signed)
Thank you :)

## 2015-04-10 NOTE — Telephone Encounter (Signed)
Left message on machine to call back  

## 2015-04-10 NOTE — Telephone Encounter (Signed)
Susan Potts, She cancelled this morning for her EUS today.  Please call her later to see if she wants to reschedule, offer next available EUS Thursday with MAC, 60 min, radial +/- linear.  Derryl HarborMike, FYI. We'll work to get her back on the schedule. Thanks

## 2015-04-15 NOTE — Telephone Encounter (Signed)
Unable to reach pt messages left for the pt to return call to reschedule EUS.

## 2015-04-15 NOTE — Telephone Encounter (Signed)
Left message on machine to call back  

## 2015-05-07 ENCOUNTER — Other Ambulatory Visit: Payer: Self-pay | Admitting: Nurse Practitioner

## 2015-05-07 MED ORDER — PROMETHAZINE HCL 25 MG PO TABS: ORAL_TABLET | ORAL | Status: DC

## 2015-05-14 ENCOUNTER — Encounter: Payer: Self-pay | Admitting: Gastroenterology

## 2015-05-14 ENCOUNTER — Telehealth: Payer: Self-pay | Admitting: Gastroenterology

## 2015-05-14 ENCOUNTER — Ambulatory Visit: Payer: Medicare Other | Admitting: Gastroenterology

## 2015-05-14 MED ORDER — PROMETHAZINE HCL 25 MG PO TABS: ORAL_TABLET | ORAL | Status: DC

## 2015-05-14 NOTE — Telephone Encounter (Signed)
PATIENT WAS A NO SHOW AND LETTER SENT  °

## 2015-05-27 DIAGNOSIS — F4323 Adjustment disorder with mixed anxiety and depressed mood: Secondary | ICD-10-CM | POA: Diagnosis not present

## 2015-05-27 DIAGNOSIS — F4542 Pain disorder with related psychological factors: Secondary | ICD-10-CM | POA: Diagnosis not present

## 2015-05-27 DIAGNOSIS — M961 Postlaminectomy syndrome, not elsewhere classified: Secondary | ICD-10-CM | POA: Diagnosis not present

## 2015-06-22 ENCOUNTER — Other Ambulatory Visit: Payer: Self-pay | Admitting: Nurse Practitioner

## 2015-06-23 MED ORDER — PROMETHAZINE HCL 25 MG PO TABS: ORAL_TABLET | ORAL | Status: DC

## 2015-06-24 ENCOUNTER — Encounter: Payer: Self-pay | Admitting: Nurse Practitioner

## 2015-06-24 ENCOUNTER — Ambulatory Visit (INDEPENDENT_AMBULATORY_CARE_PROVIDER_SITE_OTHER): Payer: Medicare Other | Admitting: Nurse Practitioner

## 2015-06-24 ENCOUNTER — Ambulatory Visit: Payer: Medicare Other | Admitting: Nurse Practitioner

## 2015-06-24 VITALS — BP 112/78 | HR 78 | Temp 97.1°F | Ht 67.0 in | Wt 190.8 lb

## 2015-06-24 DIAGNOSIS — K219 Gastro-esophageal reflux disease without esophagitis: Secondary | ICD-10-CM | POA: Diagnosis not present

## 2015-06-24 DIAGNOSIS — R748 Abnormal levels of other serum enzymes: Secondary | ICD-10-CM

## 2015-06-24 MED ORDER — DEXLANSOPRAZOLE 60 MG PO CPDR: 60.0000 mg | DELAYED_RELEASE_CAPSULE | Freq: Every day | ORAL | Status: DC

## 2015-06-24 NOTE — Assessment & Plan Note (Signed)
Patient with continued GERD/heartburn symptoms. She is on Prilosec 40 mg twice a day. While in pre-confident that there are some symptoms related to her elevated lipase, some of her symptoms could be contributed to by uncontrolled GERD and her last endoscopy completed in 2016 did demonstrate gastric and duodenal erosions. She has previously tried and failed Zantac, Prevacid, Protonix. We will trial her on Dexilant at this point she is failed multiple other agents. We'll provide samples for 2 weeks and send in a prescription to verify for insurance coverage. Return for follow-up in 3 months.

## 2015-06-24 NOTE — Progress Notes (Signed)
Referring Provider: Ernestina Penna, MD Primary Care Physician:  Rudi Heap, MD Primary GI:  Dr. Jena Gauss  Chief Complaint  Patient presents with  . Follow-up  . Abdominal Pain  . Pancreatitis    HPI:   55 year old female presents for follow-up on abdominal pain, dyspepsia, pancreatitis. Last endoscopy completed 03/06/2015 which found gastric and duodenal erosions status post biopsy, chronically elevated serum lipase. Recommended follow-up on pathology and proceed with scheduling of endoscopic ultrasound to further evaluate the pancreas. Surgical pathology showed benign gastric mucosa. The day she was scheduled for EUS she canceled. Dr. Christella Hartigan is trying to get her back on the schedule. Subsequently, patient was also a no-show for her most recent schedule an appointment with our office on 05/14/2015.   She does have chronically elevated lipase, most recently 262 on 12/25/2014. There are no CT changes of pancreatitis in June 2016. Repeat CT with and without contrast completed on 01/06/2015 to further evaluate shows normal pancreas on CT. Possible sub-clinical pancreatitis.  Today she states she's still having LUQ pain and nausea. Was scheduled for spinal cord implant. Surgeon told her he thinks she has cirrhosis because she looks yellow. This was 2-3 weeks ago. She hasn't called to reschedule EUS because she wasn't sure how to contact them. She is asking [place] to get her back in touch with them. Also complaining of abdominal swelling. Denies hematochezia, melena. States she only eats about one meal a day. Pain is worse after eating. Stomach "really swells up" after eating, along with nausea. No objective weight loss comparing weight today to last office visit. States Prilosec isn't working, continued heartburn. Has previously tried/failed Prevacid, Zantac, Protonix. Has not tried Nexium or Dexilant yet. Denies chest pain, dyspnea, dizziness, lightheadedness, syncope, near syncope. Denies any other  upper or lower GI symptoms.  Past Medical History  Diagnosis Date  . Fibromyalgia   . Degenerative disc disease     LS spine discectomy and fusion; parasternal rod  . Carpal tunnel syndrome   . Hyperlipidemia   . Ulnar neuritis   . Tobacco abuse     40 pack years; one pack per day  . Gastroesophageal reflux disease   . COPD (chronic obstructive pulmonary disease) (HCC)   . Neuropathy (HCC)     ambulates with cane  . Chronic neck pain   . Chronic back pain   . Shortness of breath   . PSVT (paroxysmal supraventricular tachycardia) (HCC)     normal coronary angiography in 2006; records from EMS documented supraventricular tachycardia at approximately 195 bpm on 05/10/10    Past Surgical History  Procedure Laterality Date  . Lumbar disc surgery  07/2010    fusion  . Ganglion cyst excision  2009    Right wrist  . Cesarean section  1995  . Tubal ligation  1995  . Carpal tunnel release  2009    Right  . Spinal fixation surgery w/ implant      L4, L5  . Back surgery    . Lumbar laminectomy/decompression microdiscectomy Left 09/14/2012    Procedure: LUMBAR LAMINECTOMY/DECOMPRESSION MICRODISCECTOMY 1 LEVEL;  Surgeon: Cristi Loron, MD;  Location: [facility];  Service: Neurosurgery;  Laterality: Left;  LEFT Lumbar three-four diskectomy  . Esophagogastroduodenoscopy (egd) with propofol N/A 03/06/2015    Procedure: ESOPHAGOGASTRODUODENOSCOPY (EGD) WITH PROPOFOL;  Surgeon: Corbin Ade, MD;  Location: [facility];  Service: Endoscopy;  Laterality: N/A;  . Esophageal biopsy  03/06/2015    RMR: Gastric and duodenal  erosions status post gastric biopsy. Chroniclly elevated serum lipase    Current Outpatient Prescriptions  Medication Sig Dispense Refill  . Aspirin-Salicylamide-Caffeine (BC HEADACHE POWDER PO) Take 1 packet by mouth every 6 (six) hours as needed (migraine headache).    . DULoxetine (CYMBALTA) 30 MG capsule Take 1 capsule by mouth 2 (two) times daily.    Marland Kitchen gabapentin  (NEURONTIN) 300 MG capsule Take 1 capsule by mouth 3 (three) times daily.    Marland Kitchen omeprazole (PRILOSEC) 40 MG capsule Take 40 mg by mouth 2 (two) times daily.    Marland Kitchen oxyCODONE-acetaminophen (PERCOCET/ROXICET) 5-325 MG tablet Take 1 tablet by mouth every 6 (six) hours.    . promethazine (PHENERGAN) 25 MG tablet TAKE 1/2 TAB EVERY 6 HOURS AS NEEDED FOR NAUSEA 30 tablet 1  . tiZANidine (ZANAFLEX) 2 MG tablet Take 4 mg by mouth at bedtime.     No current facility-administered medications for this visit.    Allergies as of 06/24/2015 - Review Complete 06/24/2015  Allergen Reaction Noted  . Sulfonamide derivatives Anaphylaxis and Rash     Family History  Problem Relation Age of Onset  . Arthritis Other   . Stroke Other   . Cancer Mother   . Diabetes Mother   . Hypertension Mother   . Thyroid disease Mother   . Diabetes Brother   . Hypertension Brother   . Colon cancer Neg Hx     Social History   Social History  . Marital Status: Single    Spouse Name: N/A  . Number of Children: N/A  . Years of Education: N/A   Occupational History  . Unemployed     Disabled   Social History Main Topics  . Smoking status: Former Smoker -- 1.00 packs/day for 43 years    Types: Cigarettes    Quit date: 12/31/2014  . Smokeless tobacco: Never Used     Comment: stopped smoking around June 2016, now uses VAPES  . Alcohol Use: No  . Drug Use: No  . Sexual Activity: Yes    Birth Control/ Protection: Surgical   Other Topics Concern  . None   Social History Narrative   Divorced.    Review of Systems: General: Negative for anorexia, weight loss, fever, chills, fatigue, weakness. Eyes: Negative for vision changes.  ENT: Negative for hoarseness, difficulty swallowing. CV: Negative for chest pain, angina, palpitations, peripheral edema.  Respiratory: Negative for dyspnea at rest, cough, sputum, wheezing.  GI: See history of present illness. Endo: Negative for unusual weight change.  Heme:  Negative for bruising or bleeding.   Physical Exam: BP 112/78 mmHg  Pulse 78  Temp(Src) 97.1 F (36.2 C) (Oral)  Ht  (1.702 m)  Wt 190 lb 12.8 oz (86.546 kg)  BMI 29.88 kg/m2 General:   Alert and oriented. Pleasant and cooperative. Well-nourished and well-developed.  Head:  Normocephalic and atraumatic. Eyes:  Without icterus, sclera clear and conjunctiva pink.  Ears:  Normal auditory acuity. Cardiovascular:  S1, S2 present without murmurs appreciated. Extremities without clubbing or edema. Respiratory:  Clear to auscultation bilaterally. No wheezes, rales, or rhonchi. No distress.  Gastrointestinal:  +BS, soft, and non-distended. Noted TTP epigastric and LUQ regions. No HSM noted. No guarding or rebound. No masses appreciated.  Rectal:  Deferred  Skin:  Intact without significant lesions or rashes. Neurologic:  Alert and oriented x4;  grossly normal neurologically. Psych:  Alert and cooperative. Normal mood and affect. Heme/Lymph/Immune: No excessive bruising noted.    06/24/2015 1:57 PM

## 2015-06-24 NOTE — Assessment & Plan Note (Signed)
Continued elevated lipase. CTs completed 2 with no pancreatic abnormality. Possible subclinical pancreatitis. No liver abnormalities on CT. Continues with pain and nausea postprandially. She is also complaining of bloating as well. She has not had a gastric emptying study and depending on results of her endoscopic ultrasound she may need to have one done to check for gastroparesis as an expiration for her bloating and postprandial nausea. I will change her PPI as noted above. We will help her get in touch with Dr. Christella Hartigan' office to reschedule her endoscopic ultrasound. I'll check labs today including CBC, CMP, and lipase. Return for follow-up here in 3 months, hopefully by then she will of had her EUS.

## 2015-06-24 NOTE — Patient Instructions (Signed)
1. We will help to get in contact with Dr. Elam Dutch to reschedule your endoscopic ultrasound. 2. Stop taking Prilosec. 3. Start taking Dexilant, one 60 mg tab per day. We will provide you with samples to last 2 weeks while we submit to her insurance company. 4. Have your labs drawn when you're able to. 5. Return for follow-up in 3 months.

## 2015-06-25 DIAGNOSIS — M961 Postlaminectomy syndrome, not elsewhere classified: Secondary | ICD-10-CM | POA: Diagnosis not present

## 2015-06-25 DIAGNOSIS — Z6830 Body mass index (BMI) 30.0-30.9, adult: Secondary | ICD-10-CM | POA: Diagnosis not present

## 2015-06-25 NOTE — Progress Notes (Signed)
cc'ed to pcp °

## 2015-06-27 ENCOUNTER — Telehealth: Payer: Self-pay | Admitting: Gastroenterology

## 2015-06-27 NOTE — Telephone Encounter (Signed)
Dr Christella Hartigan I spoke with the pt and she states she does want to reschedule, she cancelled the previous EUS appt and was called multiple times to reschedule and a letter mailed to her without response.  She saw Berenice Primas NP for Dr Jena Gauss 06/24/15 and she was asked to call our office to set up another EUS.  Do you want to set her up directly or should she be seen in the office first?

## 2015-07-01 NOTE — Telephone Encounter (Signed)
Minerva Areola and Kathlene November, See below.  We tried again to reschedule but she is not interested.  Please let us know if she changes her mind.  Thanks  DJ

## 2015-07-01 NOTE — Telephone Encounter (Signed)
So she wants to have the EUS?. Let's get her rescheduled directly for procedure

## 2015-07-01 NOTE — Telephone Encounter (Signed)
Dr Christella Hartigan the pt DOES want to reschedule the EUS at this time.  Do you want her set up directly or seen in the office first?

## 2015-07-01 NOTE — Telephone Encounter (Signed)
Sorry all. I think I misread an earlier note.    Patty, thanks.  Lets get her set up for upper EUS, next available EUS Thursday for abd pain, elevated pancreatic enzymes.  +MAC.

## 2015-07-02 ENCOUNTER — Other Ambulatory Visit: Payer: Self-pay

## 2015-07-02 DIAGNOSIS — R948 Abnormal results of function studies of other organs and systems: Secondary | ICD-10-CM

## 2015-07-02 NOTE — Telephone Encounter (Signed)
No answer no voice mail  

## 2015-07-02 NOTE — Telephone Encounter (Signed)
It sounds like you've done more than enough. The "ball is in her court" now.  Thanks

## 2015-07-02 NOTE — Telephone Encounter (Signed)
Dr Jena Gauss I have rescheduled the patient but I am having trouble reaching the patient.  I did mail her all the information regarding the appt date, time and instructions.  However, I have called her several times and either the line rings busy or it rings and no one answers and no voice mail is available.  I will continue to try and reach out to her but I wanted to give you a heads up about what the status is.  Thanks [employer]

## 2015-07-02 NOTE — Telephone Encounter (Signed)
Line busy

## 2015-07-03 NOTE — Telephone Encounter (Signed)
Noted, thanks for all your help!

## 2015-07-03 NOTE — Telephone Encounter (Signed)
EUS scheduled, pt instructed and medications reviewed.  Patient instructions mailed to home.  Patient to call with any questions or concerns.  

## 2015-07-11 ENCOUNTER — Encounter (HOSPITAL_COMMUNITY): Payer: Self-pay | Admitting: *Deleted

## 2015-07-15 DIAGNOSIS — R748 Abnormal levels of other serum enzymes: Secondary | ICD-10-CM | POA: Diagnosis not present

## 2015-07-15 DIAGNOSIS — K219 Gastro-esophageal reflux disease without esophagitis: Secondary | ICD-10-CM | POA: Diagnosis not present

## 2015-07-16 LAB — COMPREHENSIVE METABOLIC PANEL
ALK PHOS: 71 U/L (ref 33–130)
ALT: 8 U/L (ref 6–29)
AST: 12 U/L (ref 10–35)
Albumin: 4.4 g/dL (ref 3.6–5.1)
BUN: 13 mg/dL (ref 7–25)
CO2: 27 mmol/L (ref 20–31)
CREATININE: 0.66 mg/dL (ref 0.50–1.05)
Calcium: 9 mg/dL (ref 8.6–10.4)
Chloride: 105 mmol/L (ref 98–110)
Glucose, Bld: 122 mg/dL — ABNORMAL HIGH (ref 65–99)
POTASSIUM: 4.5 mmol/L (ref 3.5–5.3)
SODIUM: 139 mmol/L (ref 135–146)
TOTAL PROTEIN: 6.8 g/dL (ref 6.1–8.1)
Total Bilirubin: 0.4 mg/dL (ref 0.2–1.2)

## 2015-07-16 LAB — CBC WITH DIFFERENTIAL/PLATELET
BASOS ABS: 0.1 10*3/uL (ref 0.0–0.1)
Basophils Relative: 1 % (ref 0–1)
EOS ABS: 0.2 10*3/uL (ref 0.0–0.7)
Eosinophils Relative: 3 % (ref 0–5)
HCT: 40.1 % (ref 36.0–46.0)
HEMOGLOBIN: 13.3 g/dL (ref 12.0–15.0)
LYMPHS ABS: 2.2 10*3/uL (ref 0.7–4.0)
Lymphocytes Relative: 36 % (ref 12–46)
MCH: 30.9 pg (ref 26.0–34.0)
MCHC: 33.2 g/dL (ref 30.0–36.0)
MCV: 93 fL (ref 78.0–100.0)
MPV: 10.7 fL (ref 8.6–12.4)
Monocytes Absolute: 0.3 10*3/uL (ref 0.1–1.0)
Monocytes Relative: 5 % (ref 3–12)
NEUTROS ABS: 3.3 10*3/uL (ref 1.7–7.7)
NEUTROS PCT: 55 % (ref 43–77)
PLATELETS: 230 10*3/uL (ref 150–400)
RBC: 4.31 MIL/uL (ref 3.87–5.11)
RDW: 13.4 % (ref 11.5–15.5)
WBC: 6 10*3/uL (ref 4.0–10.5)

## 2015-07-16 LAB — LIPASE: LIPASE: 50 U/L (ref 7–60)

## 2015-07-18 ENCOUNTER — Other Ambulatory Visit: Payer: Self-pay | Admitting: Nurse Practitioner

## 2015-07-24 ENCOUNTER — Encounter (HOSPITAL_COMMUNITY)
Admission: RE | Disposition: A | Discharge status: Home / Self Care | Payer: Self-pay | Source: Ambulatory Visit | Attending: Gastroenterology

## 2015-07-24 ENCOUNTER — Encounter (HOSPITAL_COMMUNITY): Payer: Self-pay | Admitting: *Deleted

## 2015-07-24 ENCOUNTER — Ambulatory Visit (HOSPITAL_COMMUNITY)
Admission: RE | Admit: 2015-07-24 | Discharge: 2015-07-24 | Disposition: A | Discharge status: Home / Self Care | Payer: Medicare Other | Source: Ambulatory Visit | Attending: Gastroenterology | Admitting: Gastroenterology

## 2015-07-24 ENCOUNTER — Ambulatory Visit (HOSPITAL_COMMUNITY): Payer: Medicare Other | Admitting: Anesthesiology

## 2015-07-24 DIAGNOSIS — G709 Myoneural disorder, unspecified: Secondary | ICD-10-CM | POA: Diagnosis not present

## 2015-07-24 DIAGNOSIS — M797 Fibromyalgia: Secondary | ICD-10-CM | POA: Insufficient documentation

## 2015-07-24 DIAGNOSIS — M1990 Unspecified osteoarthritis, unspecified site: Secondary | ICD-10-CM | POA: Diagnosis not present

## 2015-07-24 DIAGNOSIS — R748 Abnormal levels of other serum enzymes: Secondary | ICD-10-CM | POA: Insufficient documentation

## 2015-07-24 DIAGNOSIS — E785 Hyperlipidemia, unspecified: Secondary | ICD-10-CM | POA: Insufficient documentation

## 2015-07-24 DIAGNOSIS — J449 Chronic obstructive pulmonary disease, unspecified: Secondary | ICD-10-CM | POA: Insufficient documentation

## 2015-07-24 DIAGNOSIS — R110 Nausea: Secondary | ICD-10-CM | POA: Diagnosis not present

## 2015-07-24 DIAGNOSIS — R1012 Left upper quadrant pain: Secondary | ICD-10-CM | POA: Insufficient documentation

## 2015-07-24 DIAGNOSIS — Z79891 Long term (current) use of opiate analgesic: Secondary | ICD-10-CM | POA: Insufficient documentation

## 2015-07-24 DIAGNOSIS — M542 Cervicalgia: Secondary | ICD-10-CM | POA: Diagnosis not present

## 2015-07-24 DIAGNOSIS — R948 Abnormal results of function studies of other organs and systems: Secondary | ICD-10-CM

## 2015-07-24 DIAGNOSIS — G8929 Other chronic pain: Secondary | ICD-10-CM | POA: Diagnosis not present

## 2015-07-24 DIAGNOSIS — I471 Supraventricular tachycardia: Secondary | ICD-10-CM | POA: Diagnosis not present

## 2015-07-24 DIAGNOSIS — G629 Polyneuropathy, unspecified: Secondary | ICD-10-CM | POA: Insufficient documentation

## 2015-07-24 DIAGNOSIS — M5137 Other intervertebral disc degeneration, lumbosacral region: Secondary | ICD-10-CM | POA: Diagnosis not present

## 2015-07-24 DIAGNOSIS — Z87891 Personal history of nicotine dependence: Secondary | ICD-10-CM | POA: Insufficient documentation

## 2015-07-24 DIAGNOSIS — K219 Gastro-esophageal reflux disease without esophagitis: Secondary | ICD-10-CM | POA: Insufficient documentation

## 2015-07-24 DIAGNOSIS — Z79899 Other long term (current) drug therapy: Secondary | ICD-10-CM | POA: Diagnosis not present

## 2015-07-24 DIAGNOSIS — R109 Unspecified abdominal pain: Secondary | ICD-10-CM | POA: Diagnosis present

## 2015-07-24 HISTORY — PX: EUS: SHX5427

## 2015-07-24 HISTORY — DX: Acute pancreatitis without necrosis or infection, unspecified: K85.90

## 2015-07-24 SURGERY — UPPER ENDOSCOPIC ULTRASOUND (EUS) LINEAR
Anesthesia: Monitor Anesthesia Care

## 2015-07-24 MED ORDER — LIDOCAINE HCL (CARDIAC) 20 MG/ML IV SOLN
INTRAVENOUS | Status: AC
  Filled 2015-07-24: qty 5

## 2015-07-24 MED ORDER — PROPOFOL 500 MG/50ML IV EMUL
INTRAVENOUS | Status: DC | PRN
  Administered 2015-07-24: 100 ug/kg/min via INTRAVENOUS

## 2015-07-24 MED ORDER — LACTATED RINGERS IV SOLN
INTRAVENOUS | Status: DC
  Administered 2015-07-24: 1000 mL via INTRAVENOUS

## 2015-07-24 MED ORDER — SODIUM CHLORIDE 0.9 % IV SOLN: INTRAVENOUS | Status: DC

## 2015-07-24 MED ORDER — LIDOCAINE HCL (CARDIAC) 20 MG/ML IV SOLN
INTRAVENOUS | Status: DC | PRN
  Administered 2015-07-24: 50 mg via INTRATRACHEAL

## 2015-07-24 MED ORDER — PROPOFOL 500 MG/50ML IV EMUL
INTRAVENOUS | Status: DC | PRN
  Administered 2015-07-24: 40 mg via INTRAVENOUS
  Administered 2015-07-24 (×3): 20 mg via INTRAVENOUS

## 2015-07-24 MED ORDER — PROPOFOL 10 MG/ML IV BOLUS
INTRAVENOUS | Status: AC
  Filled 2015-07-24: qty 40

## 2015-07-24 NOTE — Anesthesia Postprocedure Evaluation (Signed)
Anesthesia Post Note  Patient: Susan Potts  Procedure(s) Performed: Procedure(s) (LRB): UPPER ENDOSCOPIC ULTRASOUND (EUS) LINEAR (N/A)  Patient location during evaluation: PACU Anesthesia Type: MAC Level of consciousness: awake and alert Pain management: pain level controlled Vital Signs Assessment: post-procedure vital signs reviewed and stable Respiratory status: spontaneous breathing, nonlabored ventilation, respiratory function stable and patient connected to nasal cannula oxygen Cardiovascular status: stable and blood pressure returned to baseline Anesthetic complications: no    Last Vitals:  Filed Vitals:   07/24/15 0825 07/24/15 0830  BP: 122/75 132/80  Pulse: 67 66  Temp:    Resp: 16 16    Last Pain:  Filed Vitals:   07/24/15 0835  PainSc: 5                  Chaundra Abreu J

## 2015-07-24 NOTE — Interval H&P Note (Signed)
History and Physical Interval Note:  07/24/2015 7:07 AM  Susan Potts  has presented today for surgery, with the diagnosis of elevated pancreatic enzymes   The various methods of treatment have been discussed with the patient and family. After consideration of risks, benefits and other options for treatment, the patient has consented to  Procedure(s): UPPER ENDOSCOPIC ULTRASOUND (EUS) LINEAR (N/A) as a surgical intervention .  The patient's history has been reviewed, patient examined, no change in status, stable for surgery.  I have reviewed the patient's chart and labs.  Questions were answered to the patient's satisfaction.     Rachael Fee

## 2015-07-24 NOTE — Discharge Instructions (Signed)

## 2015-07-24 NOTE — H&P (View-Only) (Signed)
Referring Provider: Ernestina Penna, MD Primary Care Physician:  Rudi Heap, MD Primary GI:  Dr. Jena Gauss  Chief Complaint  Patient presents with  . Follow-up  . Abdominal Pain  . Pancreatitis    HPI:   55 year old female presents for follow-up on abdominal pain, dyspepsia, pancreatitis. Last endoscopy completed 03/06/2015 which found gastric and duodenal erosions status post biopsy, chronically elevated serum lipase. Recommended follow-up on pathology and proceed with scheduling of endoscopic ultrasound to further evaluate the pancreas. Surgical pathology showed benign gastric mucosa. The day she was scheduled for EUS she canceled. Dr. Christella Hartigan is trying to get her back on the schedule. Subsequently, patient was also a no-show for her most recent schedule an appointment with our office on 05/14/2015.   She does have chronically elevated lipase, most recently 262 on 12/25/2014. There are no CT changes of pancreatitis in June 2016. Repeat CT with and without contrast completed on 01/06/2015 to further evaluate shows normal pancreas on CT. Possible sub-clinical pancreatitis.  Today she states she's still having LUQ pain and nausea. Was scheduled for spinal cord implant. Surgeon told her he thinks she has cirrhosis because she looks yellow. This was 2-3 weeks ago. She hasn't called to reschedule EUS because she wasn't sure how to contact them. She is asking [place] to get her back in touch with them. Also complaining of abdominal swelling. Denies hematochezia, melena. States she only eats about one meal a day. Pain is worse after eating. Stomach "really swells up" after eating, along with nausea. No objective weight loss comparing weight today to last office visit. States Prilosec isn't working, continued heartburn. Has previously tried/failed Prevacid, Zantac, Protonix. Has not tried Nexium or Dexilant yet. Denies chest pain, dyspnea, dizziness, lightheadedness, syncope, near syncope. Denies any other  upper or lower GI symptoms.  Past Medical History  Diagnosis Date  . Fibromyalgia   . Degenerative disc disease     LS spine discectomy and fusion; parasternal rod  . Carpal tunnel syndrome   . Hyperlipidemia   . Ulnar neuritis   . Tobacco abuse     40 pack years; one pack per day  . Gastroesophageal reflux disease   . COPD (chronic obstructive pulmonary disease) (HCC)   . Neuropathy (HCC)     ambulates with cane  . Chronic neck pain   . Chronic back pain   . Shortness of breath   . PSVT (paroxysmal supraventricular tachycardia) (HCC)     normal coronary angiography in 2006; records from EMS documented supraventricular tachycardia at approximately 195 bpm on 05/10/10    Past Surgical History  Procedure Laterality Date  . Lumbar disc surgery  07/2010    fusion  . Ganglion cyst excision  2009    Right wrist  . Cesarean section  1995  . Tubal ligation  1995  . Carpal tunnel release  2009    Right  . Spinal fixation surgery w/ implant      L4, L5  . Back surgery    . Lumbar laminectomy/decompression microdiscectomy Left 09/14/2012    Procedure: LUMBAR LAMINECTOMY/DECOMPRESSION MICRODISCECTOMY 1 LEVEL;  Surgeon: Cristi Loron, MD;  Location: [facility];  Service: Neurosurgery;  Laterality: Left;  LEFT Lumbar three-four diskectomy  . Esophagogastroduodenoscopy (egd) with propofol N/A 03/06/2015    Procedure: ESOPHAGOGASTRODUODENOSCOPY (EGD) WITH PROPOFOL;  Surgeon: Corbin Ade, MD;  Location: [facility];  Service: Endoscopy;  Laterality: N/A;  . Esophageal biopsy  03/06/2015    RMR: Gastric and duodenal  erosions status post gastric biopsy. Chroniclly elevated serum lipase    Current Outpatient Prescriptions  Medication Sig Dispense Refill  . Aspirin-Salicylamide-Caffeine (BC HEADACHE POWDER PO) Take 1 packet by mouth every 6 (six) hours as needed (migraine headache).    . DULoxetine (CYMBALTA) 30 MG capsule Take 1 capsule by mouth 2 (two) times daily.    Marland Kitchen gabapentin  (NEURONTIN) 300 MG capsule Take 1 capsule by mouth 3 (three) times daily.    Marland Kitchen omeprazole (PRILOSEC) 40 MG capsule Take 40 mg by mouth 2 (two) times daily.    Marland Kitchen oxyCODONE-acetaminophen (PERCOCET/ROXICET) 5-325 MG tablet Take 1 tablet by mouth every 6 (six) hours.    . promethazine (PHENERGAN) 25 MG tablet TAKE 1/2 TAB EVERY 6 HOURS AS NEEDED FOR NAUSEA 30 tablet 1  . tiZANidine (ZANAFLEX) 2 MG tablet Take 4 mg by mouth at bedtime.     No current facility-administered medications for this visit.    Allergies as of 06/24/2015 - Review Complete 06/24/2015  Allergen Reaction Noted  . Sulfonamide derivatives Anaphylaxis and Rash     Family History  Problem Relation Age of Onset  . Arthritis Other   . Stroke Other   . Cancer Mother   . Diabetes Mother   . Hypertension Mother   . Thyroid disease Mother   . Diabetes Brother   . Hypertension Brother   . Colon cancer Neg Hx     Social History   Social History  . Marital Status: Single    Spouse Name: N/A  . Number of Children: N/A  . Years of Education: N/A   Occupational History  . Unemployed     Disabled   Social History Main Topics  . Smoking status: Former Smoker -- 1.00 packs/day for 43 years    Types: Cigarettes    Quit date: 12/31/2014  . Smokeless tobacco: Never Used     Comment: stopped smoking around June 2016, now uses VAPES  . Alcohol Use: No  . Drug Use: No  . Sexual Activity: Yes    Birth Control/ Protection: Surgical   Other Topics Concern  . None   Social History Narrative   Divorced.    Review of Systems: General: Negative for anorexia, weight loss, fever, chills, fatigue, weakness. Eyes: Negative for vision changes.  ENT: Negative for hoarseness, difficulty swallowing. CV: Negative for chest pain, angina, palpitations, peripheral edema.  Respiratory: Negative for dyspnea at rest, cough, sputum, wheezing.  GI: See history of present illness. Endo: Negative for unusual weight change.  Heme:  Negative for bruising or bleeding.   Physical Exam: BP 112/78 mmHg  Pulse 78  Temp(Src) 97.1 F (36.2 C) (Oral)  Ht  (1.702 m)  Wt 190 lb 12.8 oz (86.546 kg)  BMI 29.88 kg/m2 General:   Alert and oriented. Pleasant and cooperative. Well-nourished and well-developed.  Head:  Normocephalic and atraumatic. Eyes:  Without icterus, sclera clear and conjunctiva pink.  Ears:  Normal auditory acuity. Cardiovascular:  S1, S2 present without murmurs appreciated. Extremities without clubbing or edema. Respiratory:  Clear to auscultation bilaterally. No wheezes, rales, or rhonchi. No distress.  Gastrointestinal:  +BS, soft, and non-distended. Noted TTP epigastric and LUQ regions. No HSM noted. No guarding or rebound. No masses appreciated.  Rectal:  Deferred  Skin:  Intact without significant lesions or rashes. Neurologic:  Alert and oriented x4;  grossly normal neurologically. Psych:  Alert and cooperative. Normal mood and affect. Heme/Lymph/Immune: No excessive bruising noted.    06/24/2015 1:57 PM

## 2015-07-24 NOTE — Op Note (Signed)
[facility] [address] [place], [address]   ENDOSCOPIC ULTRASOUND PROCEDURE REPORT  PATIENT: Susan Potts, Susan Potts  MR#: [id number] BIRTHDATE: 1961-05-20  GENDER: female ENDOSCOPIST: Rachael Fee, MD REFERRED BY:  Roetta Sessions, M.D. PROCEDURE DATE:  07/24/2015 PROCEDURE:   Upper EUS ASA CLASS:      Class III INDICATIONS:   1.  abdominal pain, elevated pancreatic enzymes; two CT scans in 2016 showed normal pancreas; non-drinker, no FH of pancreatic disease. MEDICATIONS: Monitored anesthesia care  DESCRIPTION OF PROCEDURE:   After the risks benefits and alternatives of the procedure were  explained, informed consent was obtained. The patient was then placed in the left, lateral, decubitus postion and IV sedation was administered. Throughout the procedure, the patients blood pressure, pulse and oxygen saturations were monitored continuously.  Under direct visualization, the Pentax Radial EUS [id number]  endoscope was introduced through the mouth  and advanced to the second portion of the duodenum .  Water was used as necessary to provide an acoustic interface.  Upon completion of the imaging, water was removed and the patient was sent to the recovery room in satisfactory condition.  Endoscopic findings (limited views with radial echoendoscope): 1. Normal UGI tract  EUS findings: 1.  Pancreatic parenchyma was normal throughout the gland.  No discrete masses and no signs of chronic pancreatitis. 2. Main pancreatic duct was normal, non-dilated. 3. CBD was normal; non-dilated and without filling defects 3. No peripancreatic adenopathy 4. Gallbladder was not well visualized 5. Limited views of liver, spleen, portal and splenic vessels were all normal.  ENDOSCOPIC IMPRESSION: Normal examination. Although pancreatic enzymes have been elevated, it is not clear that she's actually had bouts of acute pancreatitis given repeated normal appearance of pancreas on  imaging.  RECOMMENDATIONS: Observe clinically.  Will communicate these results with Dr. Jena Gauss.  _______________________________ eSigned:  Rachael Fee, MD 07/24/2015 8:08 AM

## 2015-07-24 NOTE — Anesthesia Preprocedure Evaluation (Addendum)
Anesthesia Evaluation  Patient identified by MRN, date of birth, ID band Patient awake    Reviewed: Allergy & Precautions, NPO status , Patient's Chart, lab work & pertinent test results  Airway Mallampati: II  TM Distance: >3 FB Neck ROM: Full    Dental  (+) Edentulous Lower, Edentulous Upper   Pulmonary shortness of breath, COPD, former smoker,    Pulmonary exam normal breath sounds clear to auscultation       Cardiovascular Normal cardiovascular exam Rhythm:Regular Rate:Normal  H/O PSVT   Neuro/Psych  Neuromuscular disease negative psych ROS   GI/Hepatic Neg liver ROS, GERD  Medicated,  Endo/Other  negative endocrine ROS  Renal/GU negative Renal ROS  negative genitourinary   Musculoskeletal  (+) Arthritis , Fibromyalgia -  Abdominal   Peds negative pediatric ROS (+)  Hematology negative hematology ROS (+)   Anesthesia Other Findings   Reproductive/Obstetrics negative OB ROS                          Anesthesia Physical Anesthesia Plan  ASA: III  Anesthesia Plan: MAC   Post-op Pain Management:    Induction: Intravenous  Airway Management Planned: Natural Airway  Additional Equipment:   Intra-op Plan:   Post-operative Plan:   Informed Consent: I have reviewed the patients History and Physical, chart, labs and discussed the procedure including the risks, benefits and alternatives for the proposed anesthesia with the patient or authorized representative who has indicated his/her understanding and acceptance.   Dental advisory given  Plan Discussed with: CRNA  Anesthesia Plan Comments:        Anesthesia Quick Evaluation

## 2015-07-24 NOTE — Transfer of Care (Signed)
Immediate Anesthesia Transfer of Care Note  Patient: Susan Potts  Procedure(s) Performed: Procedure(s): UPPER ENDOSCOPIC ULTRASOUND (EUS) LINEAR (N/A)  Patient Location: PACU  Anesthesia Type:MAC  Level of Consciousness: awake, alert  and oriented  Airway & Oxygen Therapy: Patient Spontanous Breathing and Patient connected to nasal cannula oxygen  Post-op Assessment: Report given to RN and Post -op Vital signs reviewed and stable  Post vital signs: Reviewed and stable  Last Vitals:  Filed Vitals:   07/24/15 0646  BP: 102/65  Pulse: 70  Temp: 36.6 C  Resp: 14    Complications: No apparent anesthesia complications

## 2015-07-25 ENCOUNTER — Encounter (HOSPITAL_COMMUNITY): Payer: Self-pay | Admitting: Gastroenterology

## 2015-08-14 ENCOUNTER — Other Ambulatory Visit: Payer: Self-pay

## 2015-08-15 MED ORDER — PROMETHAZINE HCL 25 MG PO TABS: ORAL_TABLET | ORAL | Status: DC

## 2015-09-22 ENCOUNTER — Encounter: Payer: Self-pay | Admitting: Nurse Practitioner

## 2015-09-22 ENCOUNTER — Ambulatory Visit: Payer: Medicare Other | Admitting: Nurse Practitioner

## 2015-09-22 ENCOUNTER — Telehealth: Payer: Self-pay | Admitting: Nurse Practitioner

## 2015-09-22 NOTE — Telephone Encounter (Signed)
PT WAS A NO SHOW AND LETTER SENT  °

## 2015-09-23 NOTE — Telephone Encounter (Signed)
Noted  

## 2015-10-10 DIAGNOSIS — M545 Low back pain: Secondary | ICD-10-CM | POA: Diagnosis not present

## 2015-10-10 DIAGNOSIS — Z6831 Body mass index (BMI) 31.0-31.9, adult: Secondary | ICD-10-CM | POA: Diagnosis not present

## 2015-10-15 ENCOUNTER — Other Ambulatory Visit: Payer: Self-pay | Admitting: Neurosurgery

## 2015-10-28 NOTE — Pre-Procedure Instructions (Signed)
Lonia MadRebecca L Asselin  10/28/2015      [facility] - [place], [place] - [address] [place] [id number] Phone: [phone] Fax: [phone]    Your procedure is scheduled on Thursday, June 1st, 2017  [facility] 12:00 P.M.   Call this number if you have problems the morning of surgery:  [phone]   Remember:  Do not eat food or drink liquids after midnight.   Take these medicines the morning of surgery with A SIP OF WATER: dexlansoprazole (Dexilant), Dulozetine (Cymbalta), gabapentin (neurontin), percocet if needed, Ondansetron (Zofran) if needed, Promethazine (Phenergan) if needed.   7 days prior to surgery STOP taking any Aspirin, Aleve, Naproxen, Ibuprofen, Motrin, Advil, Goody's, BC's, all herbal medications, fish oil, and all vitamins    Do not wear jewelry, make-up or nail polish.  Do not wear lotions, powders, or perfumes.  You may NOT wear deodorant.  Do not shave 48 hours prior to surgery.  Do not bring valuables to the hospital.   [facility] is not responsible for any belongings or valuables.  Contacts, dentures or bridgework may not be worn into surgery.  Leave your suitcase in the car.  After surgery it may be brought to your room.  For patients admitted to the hospital, discharge time will be determined by your treatment team.  Patients discharged the day of surgery will not be allowed to drive home.    Special instructions:   [facility]- Preparing For Surgery  Before surgery, you can play an important role. Because skin is not sterile, your skin needs to be as free of germs as possible. You can reduce the number of germs on your skin by washing with CHG (chlorahexidine gluconate) Soap before surgery.  CHG is an antiseptic cleaner which kills germs and bonds with the skin to continue killing germs even after washing.  Please do not use if you have an allergy to CHG or antibacterial soaps. If your  skin becomes reddened/irritated stop using the CHG.  Do not shave (including legs and underarms) for at least 48 hours prior to first CHG shower. It is OK to shave your face.  Please follow these instructions carefully.   1. Shower the night before surgery and the morning of surgery with CHG.   2. If you chose to wash your hair, wash your hair first as usual with your normal shampoo.  3. After you shampoo, rinse your hair and body thoroughly to remove the shampoo.  4. Use CHG as you would any other liquid soap. You can apply CHG directly to the skin and wash gently with a scrungie or a clean washcloth.   5. Apply the CHG Soap to your body ONLY FROM THE NECK DOWN.  Do not use on open wounds or open sores. Avoid contact with your eyes, ears, mouth and genitals (private parts). Wash genitals (private parts) with your normal soap.  6. Wash thoroughly, paying special attention to the area where your surgery will be performed.  7. Thoroughly rinse your body with warm water from the neck down.  8. DO NOT shower/wash with your normal soap after using and rinsing off the CHG Soap.  9. Pat yourself dry with a clean towel.   10. Wear clean pajamas   11. Place clean sheets on your bed the night of your first shower and do not sleep with pets.    Day of Surgery: Do not apply  any deodorants/lotions. Please wear clean clothes to the hospital/surgery center.      Please read over the following fact sheets that you were given. Pain Booklet, Coughing and Deep Breathing, MRSA Information and Surgical Site Infection Prevention

## 2015-10-29 ENCOUNTER — Encounter (HOSPITAL_COMMUNITY): Payer: Self-pay

## 2015-10-29 ENCOUNTER — Encounter (HOSPITAL_COMMUNITY)
Admission: RE | Admit: 2015-10-29 | Discharge: 2015-10-29 | Disposition: A | Discharge status: Home / Self Care | Payer: Medicare Other | Source: Ambulatory Visit | Attending: Neurosurgery | Admitting: Neurosurgery

## 2015-10-29 DIAGNOSIS — Z01818 Encounter for other preprocedural examination: Secondary | ICD-10-CM | POA: Diagnosis not present

## 2015-10-29 DIAGNOSIS — Z01812 Encounter for preprocedural laboratory examination: Secondary | ICD-10-CM | POA: Diagnosis not present

## 2015-10-29 DIAGNOSIS — G8929 Other chronic pain: Secondary | ICD-10-CM | POA: Diagnosis not present

## 2015-10-29 DIAGNOSIS — M545 Low back pain: Secondary | ICD-10-CM | POA: Insufficient documentation

## 2015-10-29 DIAGNOSIS — I471 Supraventricular tachycardia: Secondary | ICD-10-CM | POA: Insufficient documentation

## 2015-10-29 DIAGNOSIS — R001 Bradycardia, unspecified: Secondary | ICD-10-CM | POA: Diagnosis not present

## 2015-10-29 HISTORY — DX: Personal history of other diseases of the respiratory system: Z87.09

## 2015-10-29 HISTORY — DX: Anemia, unspecified: D64.9

## 2015-10-29 HISTORY — DX: Personal history of other diseases of the digestive system: Z87.19

## 2015-10-29 LAB — CBC
HCT: 42.3 % (ref 36.0–46.0)
HEMOGLOBIN: 13.5 g/dL (ref 12.0–15.0)
MCH: 30.3 pg (ref 26.0–34.0)
MCHC: 31.9 g/dL (ref 30.0–36.0)
MCV: 95.1 fL (ref 78.0–100.0)
Platelets: 210 10*3/uL (ref 150–400)
RBC: 4.45 MIL/uL (ref 3.87–5.11)
RDW: 12.7 % (ref 11.5–15.5)
WBC: 8 10*3/uL (ref 4.0–10.5)

## 2015-10-29 LAB — BASIC METABOLIC PANEL
ANION GAP: 8 (ref 5–15)
BUN: 12 mg/dL (ref 6–20)
CHLORIDE: 103 mmol/L (ref 101–111)
CO2: 27 mmol/L (ref 22–32)
Calcium: 9.5 mg/dL (ref 8.9–10.3)
Creatinine, Ser: 0.68 mg/dL (ref 0.44–1.00)
GFR calc Af Amer: >60 mL/min (ref >60)
Glucose, Bld: 116 mg/dL — ABNORMAL HIGH (ref 65–99)
POTASSIUM: 4.2 mmol/L (ref 3.5–5.1)
SODIUM: 138 mmol/L (ref 135–145)

## 2015-10-29 LAB — NO BLOOD PRODUCTS

## 2015-10-29 LAB — SURGICAL PCR SCREEN
MRSA, PCR: NEGATIVE
STAPHYLOCOCCUS AUREUS: POSITIVE — AB

## 2015-10-29 NOTE — Progress Notes (Signed)
PCP - [facility] [place] doesn't see a PCP on a regular basis Cardiologist - [place] Bing[place]bart, Robert MD - hasn't see in 3 years  Chest x-ray - none EKG - pending Stress Test - 2006 ECHO - 2006 Cardiac Cath - denies  Patient refuses blood products, in chart, faxed form to blood bank  Patient denies shortness of breath and chest pain at PAT appointment

## 2015-10-29 NOTE — Progress Notes (Signed)
PCR positive for Staph, [facility] and [place].  Called patient and left message with patient's daughter for patient to pick up prescription.

## 2015-11-05 MED ORDER — CEFAZOLIN SODIUM-DEXTROSE 2-4 GM/100ML-% IV SOLN
2.0000 g | INTRAVENOUS | Status: AC
Start: 2015-11-06 — End: 2015-11-06
  Administered 2015-11-06: 2 g via INTRAVENOUS
  Filled 2015-11-05: qty 100

## 2015-11-06 ENCOUNTER — Ambulatory Visit (HOSPITAL_COMMUNITY): Payer: Medicare Other

## 2015-11-06 ENCOUNTER — Encounter (HOSPITAL_COMMUNITY): Payer: Self-pay | Admitting: Anesthesiology

## 2015-11-06 ENCOUNTER — Ambulatory Visit (HOSPITAL_COMMUNITY): Payer: Medicare Other | Admitting: Emergency Medicine

## 2015-11-06 ENCOUNTER — Ambulatory Visit (HOSPITAL_COMMUNITY)
Admission: RE | Admit: 2015-11-06 | Discharge: 2015-11-07 | Disposition: A | Discharge status: Home / Self Care | Payer: Medicare Other | Source: Ambulatory Visit | Attending: Neurosurgery | Admitting: Neurosurgery

## 2015-11-06 ENCOUNTER — Ambulatory Visit (HOSPITAL_COMMUNITY): Payer: Medicare Other | Admitting: Anesthesiology

## 2015-11-06 ENCOUNTER — Encounter (HOSPITAL_COMMUNITY)
Admission: RE | Disposition: A | Discharge status: Home / Self Care | Payer: Self-pay | Source: Ambulatory Visit | Attending: Neurosurgery

## 2015-11-06 DIAGNOSIS — Z981 Arthrodesis status: Secondary | ICD-10-CM | POA: Insufficient documentation

## 2015-11-06 DIAGNOSIS — G629 Polyneuropathy, unspecified: Secondary | ICD-10-CM | POA: Diagnosis not present

## 2015-11-06 DIAGNOSIS — Z79899 Other long term (current) drug therapy: Secondary | ICD-10-CM | POA: Diagnosis not present

## 2015-11-06 DIAGNOSIS — G5601 Carpal tunnel syndrome, right upper limb: Secondary | ICD-10-CM | POA: Diagnosis not present

## 2015-11-06 DIAGNOSIS — Z7982 Long term (current) use of aspirin: Secondary | ICD-10-CM | POA: Diagnosis not present

## 2015-11-06 DIAGNOSIS — Z87891 Personal history of nicotine dependence: Secondary | ICD-10-CM | POA: Insufficient documentation

## 2015-11-06 DIAGNOSIS — K219 Gastro-esophageal reflux disease without esophagitis: Secondary | ICD-10-CM | POA: Diagnosis not present

## 2015-11-06 DIAGNOSIS — M545 Low back pain, unspecified: Secondary | ICD-10-CM | POA: Diagnosis present

## 2015-11-06 DIAGNOSIS — G8929 Other chronic pain: Secondary | ICD-10-CM | POA: Diagnosis not present

## 2015-11-06 DIAGNOSIS — M549 Dorsalgia, unspecified: Secondary | ICD-10-CM | POA: Diagnosis not present

## 2015-11-06 DIAGNOSIS — E669 Obesity, unspecified: Secondary | ICD-10-CM | POA: Insufficient documentation

## 2015-11-06 DIAGNOSIS — Z419 Encounter for procedure for purposes other than remedying health state, unspecified: Secondary | ICD-10-CM

## 2015-11-06 DIAGNOSIS — M797 Fibromyalgia: Secondary | ICD-10-CM | POA: Insufficient documentation

## 2015-11-06 DIAGNOSIS — D649 Anemia, unspecified: Secondary | ICD-10-CM | POA: Diagnosis not present

## 2015-11-06 HISTORY — PX: SPINAL CORD STIMULATOR INSERTION: SHX5378

## 2015-11-06 SURGERY — INSERTION, SPINAL CORD STIMULATOR, LUMBAR
Anesthesia: General | Site: Back

## 2015-11-06 MED ORDER — SUFENTANIL CITRATE 50 MCG/ML IV SOLN
INTRAVENOUS | Status: AC
  Filled 2015-11-06: qty 1

## 2015-11-06 MED ORDER — PROPOFOL 10 MG/ML IV BOLUS
INTRAVENOUS | Status: AC
  Filled 2015-11-06: qty 20

## 2015-11-06 MED ORDER — MORPHINE SULFATE (PF) 2 MG/ML IV SOLN: 1.0000 mg | INTRAVENOUS | Status: DC | PRN

## 2015-11-06 MED ORDER — DULOXETINE HCL 30 MG PO CPEP
30.0000 mg | ORAL_CAPSULE | Freq: Two times a day (BID) | ORAL | Status: DC
  Administered 2015-11-06 – 2015-11-07 (×2): 30 mg via ORAL
  Filled 2015-11-06 (×2): qty 1

## 2015-11-06 MED ORDER — SODIUM CHLORIDE 0.9 % IR SOLN
Status: DC | PRN
  Administered 2015-11-06: 11:00:00

## 2015-11-06 MED ORDER — ONDANSETRON HCL 4 MG/2ML IJ SOLN
INTRAMUSCULAR | Status: AC
  Filled 2015-11-06: qty 2

## 2015-11-06 MED ORDER — MIDAZOLAM HCL 2 MG/2ML IJ SOLN
INTRAMUSCULAR | Status: AC
  Filled 2015-11-06: qty 2

## 2015-11-06 MED ORDER — LACTATED RINGERS IV SOLN
INTRAVENOUS | Status: DC
  Administered 2015-11-06: 09:00:00 via INTRAVENOUS

## 2015-11-06 MED ORDER — MEPERIDINE HCL 25 MG/ML IJ SOLN: 6.2500 mg | INTRAMUSCULAR | Status: DC | PRN

## 2015-11-06 MED ORDER — DIAZEPAM 5 MG PO TABS: 5.0000 mg | ORAL_TABLET | Freq: Four times a day (QID) | ORAL | Status: DC | PRN

## 2015-11-06 MED ORDER — GABAPENTIN 300 MG PO CAPS
300.0000 mg | ORAL_CAPSULE | Freq: Three times a day (TID) | ORAL | Status: DC
  Administered 2015-11-06 – 2015-11-07 (×3): 300 mg via ORAL
  Filled 2015-11-06 (×3): qty 1

## 2015-11-06 MED ORDER — SUGAMMADEX SODIUM 200 MG/2ML IV SOLN
INTRAVENOUS | Status: AC
  Filled 2015-11-06: qty 2

## 2015-11-06 MED ORDER — BACITRACIN ZINC 500 UNIT/GM EX OINT
TOPICAL_OINTMENT | CUTANEOUS | Status: DC | PRN
  Administered 2015-11-06: 1 via TOPICAL

## 2015-11-06 MED ORDER — PANTOPRAZOLE SODIUM 40 MG PO TBEC
40.0000 mg | DELAYED_RELEASE_TABLET | Freq: Every day | ORAL | Status: DC
  Administered 2015-11-07: 40 mg via ORAL
  Filled 2015-11-06: qty 1

## 2015-11-06 MED ORDER — LACTATED RINGERS IV SOLN
INTRAVENOUS | Status: DC | PRN
  Administered 2015-11-06 (×2): via INTRAVENOUS

## 2015-11-06 MED ORDER — HYDROCODONE-ACETAMINOPHEN 7.5-325 MG PO TABS: 1.0000 | ORAL_TABLET | Freq: Once | ORAL | Status: DC | PRN

## 2015-11-06 MED ORDER — SUFENTANIL CITRATE 50 MCG/ML IV SOLN
INTRAVENOUS | Status: DC | PRN
  Administered 2015-11-06: 10 ug via INTRAVENOUS
  Administered 2015-11-06: 20 ug via INTRAVENOUS
  Administered 2015-11-06 (×2): 10 ug via INTRAVENOUS

## 2015-11-06 MED ORDER — PROMETHAZINE HCL 25 MG PO TABS: 12.5000 mg | ORAL_TABLET | Freq: Four times a day (QID) | ORAL | Status: DC | PRN

## 2015-11-06 MED ORDER — ACETAMINOPHEN 325 MG PO TABS: 650.0000 mg | ORAL_TABLET | ORAL | Status: DC | PRN

## 2015-11-06 MED ORDER — MENTHOL 3 MG MT LOZG: 1.0000 | LOZENGE | OROMUCOSAL | Status: DC | PRN

## 2015-11-06 MED ORDER — LIDOCAINE 2% (20 MG/ML) 5 ML SYRINGE
INTRAMUSCULAR | Status: AC
  Filled 2015-11-06: qty 5

## 2015-11-06 MED ORDER — HEMOSTATIC AGENTS (NO CHARGE) OPTIME
TOPICAL | Status: DC | PRN
  Administered 2015-11-06: 1 via TOPICAL

## 2015-11-06 MED ORDER — ONDANSETRON HCL 4 MG PO TABS
4.0000 mg | ORAL_TABLET | Freq: Three times a day (TID) | ORAL | Status: DC | PRN
  Administered 2015-11-07: 4 mg via ORAL
  Filled 2015-11-06 (×2): qty 1

## 2015-11-06 MED ORDER — ROCURONIUM BROMIDE 50 MG/5ML IV SOLN
INTRAVENOUS | Status: AC
  Filled 2015-11-06: qty 1

## 2015-11-06 MED ORDER — OXYCODONE-ACETAMINOPHEN 5-325 MG PO TABS
1.0000 | ORAL_TABLET | ORAL | Status: DC | PRN
  Administered 2015-11-06 – 2015-11-07 (×4): 2 via ORAL
  Filled 2015-11-06 (×4): qty 2

## 2015-11-06 MED ORDER — SODIUM CHLORIDE 0.9 % IJ SOLN
INTRAMUSCULAR | Status: AC
  Filled 2015-11-06: qty 10

## 2015-11-06 MED ORDER — 0.9 % SODIUM CHLORIDE (POUR BTL) OPTIME
TOPICAL | Status: DC | PRN
  Administered 2015-11-06: 1000 mL

## 2015-11-06 MED ORDER — OXYCODONE-ACETAMINOPHEN 5-325 MG PO TABS: 1.0000 | ORAL_TABLET | Freq: Four times a day (QID) | ORAL | Status: DC | PRN

## 2015-11-06 MED ORDER — DOCUSATE SODIUM 100 MG PO CAPS
100.0000 mg | ORAL_CAPSULE | Freq: Two times a day (BID) | ORAL | Status: DC
  Administered 2015-11-06 – 2015-11-07 (×3): 100 mg via ORAL
  Filled 2015-11-06 (×3): qty 1

## 2015-11-06 MED ORDER — ONDANSETRON HCL 4 MG/2ML IJ SOLN
INTRAMUSCULAR | Status: DC | PRN
  Administered 2015-11-06: 4 mg via INTRAVENOUS

## 2015-11-06 MED ORDER — ACETAMINOPHEN 650 MG RE SUPP: 650.0000 mg | RECTAL | Status: DC | PRN

## 2015-11-06 MED ORDER — ONDANSETRON HCL 4 MG/2ML IJ SOLN
4.0000 mg | Freq: Once | INTRAMUSCULAR | Status: AC | PRN
  Administered 2015-11-06: 4 mg via INTRAVENOUS

## 2015-11-06 MED ORDER — THROMBIN 5000 UNITS EX SOLR
CUTANEOUS | Status: DC | PRN
  Administered 2015-11-06 (×2): 5000 [IU] via TOPICAL

## 2015-11-06 MED ORDER — FENTANYL CITRATE (PF) 100 MCG/2ML IJ SOLN
25.0000 ug | INTRAMUSCULAR | Status: DC | PRN
  Administered 2015-11-06 (×4): 25 ug via INTRAVENOUS

## 2015-11-06 MED ORDER — HYDROCODONE-ACETAMINOPHEN 5-325 MG PO TABS: 1.0000 | ORAL_TABLET | ORAL | Status: DC | PRN

## 2015-11-06 MED ORDER — MIDAZOLAM HCL 5 MG/5ML IJ SOLN
INTRAMUSCULAR | Status: DC | PRN
  Administered 2015-11-06: 2 mg via INTRAVENOUS

## 2015-11-06 MED ORDER — TIZANIDINE HCL 4 MG PO TABS
4.0000 mg | ORAL_TABLET | Freq: Every day | ORAL | Status: DC
  Administered 2015-11-06: 4 mg via ORAL
  Filled 2015-11-06: qty 1

## 2015-11-06 MED ORDER — ROCURONIUM BROMIDE 100 MG/10ML IV SOLN
INTRAVENOUS | Status: DC | PRN
  Administered 2015-11-06: 15 mg via INTRAVENOUS
  Administered 2015-11-06: 35 mg via INTRAVENOUS

## 2015-11-06 MED ORDER — FENTANYL CITRATE (PF) 100 MCG/2ML IJ SOLN
INTRAMUSCULAR | Status: AC
  Filled 2015-11-06: qty 2

## 2015-11-06 MED ORDER — PHENOL 1.4 % MT LIQD: 1.0000 | OROMUCOSAL | Status: DC | PRN

## 2015-11-06 MED ORDER — SUGAMMADEX SODIUM 200 MG/2ML IV SOLN
INTRAVENOUS | Status: DC | PRN
Start: 2015-11-06 — End: 2015-11-06
  Administered 2015-11-06: 100 mg via INTRAVENOUS

## 2015-11-06 MED ORDER — ALUM & MAG HYDROXIDE-SIMETH 200-200-20 MG/5ML PO SUSP: 30.0000 mL | Freq: Four times a day (QID) | ORAL | Status: DC | PRN

## 2015-11-06 MED ORDER — LACTATED RINGERS IV SOLN
INTRAVENOUS | Status: DC
Start: 2015-11-06 — End: 2015-11-07

## 2015-11-06 MED ORDER — CEFAZOLIN SODIUM-DEXTROSE 2-4 GM/100ML-% IV SOLN
2.0000 g | Freq: Three times a day (TID) | INTRAVENOUS | Status: AC
  Administered 2015-11-06 – 2015-11-07 (×2): 2 g via INTRAVENOUS
  Filled 2015-11-06 (×2): qty 100

## 2015-11-06 MED ORDER — PROPOFOL 10 MG/ML IV BOLUS
INTRAVENOUS | Status: DC | PRN
  Administered 2015-11-06: 140 mg via INTRAVENOUS

## 2015-11-06 MED ORDER — LIDOCAINE HCL (CARDIAC) 20 MG/ML IV SOLN
INTRAVENOUS | Status: DC | PRN
  Administered 2015-11-06: 80 mg via INTRAVENOUS

## 2015-11-06 MED ORDER — ONDANSETRON HCL 4 MG/2ML IJ SOLN: 4.0000 mg | INTRAMUSCULAR | Status: DC | PRN

## 2015-11-06 MED ORDER — BUPIVACAINE-EPINEPHRINE (PF) 0.5% -1:200000 IJ SOLN
INTRAMUSCULAR | Status: DC | PRN
Start: 2015-11-06 — End: 2015-11-06
  Administered 2015-11-06: 10 mL
  Administered 2015-11-06: 20 mL

## 2015-11-06 SURGICAL SUPPLY — 67 items
APL SKNCLS STERI-STRIP NONHPOA (GAUZE/BANDAGES/DRESSINGS) ×1
BAG DECANTER FOR FLEXI CONT (MISCELLANEOUS) ×3 IMPLANT
BENZOIN TINCTURE PRP APPL 2/3 (GAUZE/BANDAGES/DRESSINGS) ×2 IMPLANT
BLADE CLIPPER SURG (BLADE) IMPLANT
BRUSH SCRUB EZ PLAIN DRY (MISCELLANEOUS) ×3 IMPLANT
BUR MATCHSTICK NEURO 3.0 LAGG (BURR) ×2 IMPLANT
BUR PRECISION FLUTE 6.0 (BURR) ×3 IMPLANT
CLOSURE WOUND 1/2 X4 (GAUZE/BANDAGES/DRESSINGS) ×1
DRAPE C-ARM 42X72 X-RAY (DRAPES) ×3 IMPLANT
DRAPE INCISE IOBAN 85X60 (DRAPES) IMPLANT
DRAPE LAPAROTOMY 100X72X124 (DRAPES) ×3 IMPLANT
DRAPE POUCH INSTRU U-SHP 10X18 (DRAPES) ×3 IMPLANT
DRAPE SURG 17X23 STRL (DRAPES) ×12 IMPLANT
ELECT BLADE 4.0 EZ CLEAN MEGAD (MISCELLANEOUS) ×3
ELECT REM PT RETURN 9FT ADLT (ELECTROSURGICAL) ×3
ELECTRODE BLDE 4.0 EZ CLN MEGD (MISCELLANEOUS) IMPLANT
ELECTRODE REM PT RTRN 9FT ADLT (ELECTROSURGICAL) ×1 IMPLANT
ELEVATER PASSER (SPINAL CORD STIMULATOR) ×3
GAUZE SPONGE 4X4 12PLY STRL (GAUZE/BANDAGES/DRESSINGS) ×2 IMPLANT
GAUZE SPONGE 4X4 16PLY XRAY LF (GAUZE/BANDAGES/DRESSINGS) IMPLANT
GLOVE BIO SURGEON STRL SZ8 (GLOVE) ×3 IMPLANT
GLOVE BIO SURGEON STRL SZ8.5 (GLOVE) ×3 IMPLANT
GLOVE BIOGEL PI IND STRL 7.0 (GLOVE) IMPLANT
GLOVE BIOGEL PI INDICATOR 7.0 (GLOVE) ×8
GLOVE ECLIPSE 6.5 STRL STRAW (GLOVE) ×2 IMPLANT
GLOVE EXAM NITRILE LRG STRL (GLOVE) IMPLANT
GLOVE EXAM NITRILE MD LF STRL (GLOVE) IMPLANT
GLOVE EXAM NITRILE XL STR (GLOVE) IMPLANT
GLOVE EXAM NITRILE XS STR PU (GLOVE) IMPLANT
GLOVE SURG SS PI 6.5 STRL IVOR (GLOVE) ×6 IMPLANT
GLOVE SURG SS PI 7.0 STRL IVOR (GLOVE) ×2 IMPLANT
GOWN STRL REUS W/ TWL LRG LVL3 (GOWN DISPOSABLE) IMPLANT
GOWN STRL REUS W/ TWL XL LVL3 (GOWN DISPOSABLE) IMPLANT
GOWN STRL REUS W/TWL 2XL LVL3 (GOWN DISPOSABLE) IMPLANT
GOWN STRL REUS W/TWL LRG LVL3 (GOWN DISPOSABLE) ×9
GOWN STRL REUS W/TWL XL LVL3 (GOWN DISPOSABLE) ×6
IPG PRECISION SPECTRA (Stimulator) ×2 IMPLANT
KIT BASIN OR (CUSTOM PROCEDURE TRAY) ×3 IMPLANT
KIT CHARGING (KITS) ×2
KIT CHARGING PRECISION NEURO (KITS) IMPLANT
KIT PAT PROGRAM FREELINK (KITS) IMPLANT
KIT ROOM TURNOVER OR (KITS) ×3 IMPLANT
LEAD COVER EDGE 50CM STIM KIT (Lead) ×2 IMPLANT
LIQUID BAND (GAUZE/BANDAGES/DRESSINGS) IMPLANT
MARKER PEN SURG W/LABELS BLK (STERILIZATION PRODUCTS) ×2 IMPLANT
NEEDLE HYPO 22GX1.5 SAFETY (NEEDLE) ×3 IMPLANT
NS IRRIG 1000ML POUR BTL (IV SOLUTION) ×3 IMPLANT
PACK LAMINECTOMY NEURO (CUSTOM PROCEDURE TRAY) ×3 IMPLANT
PAD ARMBOARD 7.5X6 YLW CONV (MISCELLANEOUS) ×3 IMPLANT
PASSER ELEVATOR (SPINAL CORD STIMULATOR) IMPLANT
PATTIES SURGICAL .5 X1 (DISPOSABLE) ×1 IMPLANT
REMOTE CONTROL KIT (KITS) ×3
SPONGE LAP 4X18 X RAY DECT (DISPOSABLE) IMPLANT
SPONGE SURGIFOAM ABS GEL SZ50 (HEMOSTASIS) ×3 IMPLANT
STAPLER SKIN PROX WIDE 3.9 (STAPLE) ×3 IMPLANT
STRIP CLOSURE SKIN 1/2X4 (GAUZE/BANDAGES/DRESSINGS) ×1 IMPLANT
SUT SILK 0 TIES 10X30 (SUTURE) IMPLANT
SUT SILK 2 0 FS (SUTURE) ×7 IMPLANT
SUT SILK 2 0 TIES 10X30 (SUTURE) ×3 IMPLANT
SUT VIC AB 1 CT1 18XBRD ANBCTR (SUTURE) ×1 IMPLANT
SUT VIC AB 1 CT1 8-18 (SUTURE) ×3
SUT VIC AB 2-0 CP2 18 (SUTURE) ×3 IMPLANT
TAPE CLOTH SURG 4X10 WHT LF (GAUZE/BANDAGES/DRESSINGS) ×2 IMPLANT
TOOL LONG TUNNEL (SPINAL CORD STIMULATOR) ×2 IMPLANT
TOWEL OR 17X24 6PK STRL BLUE (TOWEL DISPOSABLE) ×3 IMPLANT
TOWEL OR 17X26 10 PK STRL BLUE (TOWEL DISPOSABLE) ×3 IMPLANT
WATER STERILE IRR 1000ML POUR (IV SOLUTION) ×3 IMPLANT

## 2015-11-06 NOTE — Anesthesia Preprocedure Evaluation (Addendum)
Anesthesia Evaluation  Patient identified by MRN, date of birth, ID band Patient awake    Reviewed: Allergy & Precautions, NPO status , Patient's Chart, lab work & pertinent test results  Airway Mallampati: III  TM Distance: >3 FB Neck ROM: Full    Dental  (+) Missing, Poor Dentition, Dental Advisory Given   Pulmonary shortness of breath and with exertion, COPD, former smoker,    Pulmonary exam normal breath sounds clear to auscultation       Cardiovascular Normal cardiovascular exam+ dysrhythmias Supra Ventricular Tachycardia  Rhythm:Regular Rate:Normal  Hx/o PSVT @195 /min - 2006 none since   Neuro/Psych Carpal tunnel syndrome  Neuromuscular disease negative psych ROS   GI/Hepatic Neg liver ROS, hiatal hernia, GERD  Medicated and Controlled,  Endo/Other  Obesity  Renal/GU negative Renal ROS  negative genitourinary   Musculoskeletal  (+) Arthritis , Osteoarthritis,  Fibromyalgia -Chronic LBP DDD Chronic neck pain   Abdominal (+) + obese,   Peds  Hematology  (+) anemia ,   Anesthesia Other Findings   Reproductive/Obstetrics                           Anesthesia Physical Anesthesia Plan  ASA: III  Anesthesia Plan: General   Post-op Pain Management:    Induction: Intravenous  Airway Management Planned: Oral ETT  Additional Equipment:   Intra-op Plan:   Post-operative Plan: Extubation in OR  Informed Consent: I have reviewed the patients History and Physical, chart, labs and discussed the procedure including the risks, benefits and alternatives for the proposed anesthesia with the patient or authorized representative who has indicated his/her understanding and acceptance.   Dental advisory given  Plan Discussed with: CRNA, Anesthesiologist and Surgeon  Anesthesia Plan Comments:         Anesthesia Quick Evaluation

## 2015-11-06 NOTE — Op Note (Signed)
Brief history: The patient is a [age] year old white female on whom I previously performed a lumbar fusion. The patient has has chronic back pain. She has had a temporary spinal cord stimulator trial which was successful. I discussed the situation with the patient. She has decided to proceed with placement of a permanent spinal cord stimulator after weighing the risks, benefits, and alternatives to surgery.  Preoperative diagnosis: Chronic lumbago  Postop diagnosis: The same  Procedure: T9-T10 laminectomy for Placement of [employer] spinal cord stimulator  Surgeon: Dr. Delma OfficerJeff Avari Nevares  Assistant: None  Anesthesia: Gen. endotracheal  Estimated blood loss: Minimal  Specimens: None  Complications: None  Drains: None  Description of procedure: The patient was brought to the operating room by the anesthesia team. General endotracheal anesthesia was induced. The patient was then turned to the prone position on the chest rolls. Her thoraco lumbar region was then prepared with Betadine scrub and Betadine solution. Sterile drapes were applied. I then injected the area to be incised with Marcaine with epinephrine solution. I used a scalpel to make a midline incision over the T9-10 interspace. I used electrocautery to perform a left-sided subperiosteal dissection exposing the less process process and lamina of T9 and T10. We confirmed our location with intraoperative fluoroscopy. I inserted the McCullough retractor for exposure. I then used a high-speed drill to create a left T9 laminotomy. I widen the laminotomy with occurs punch removing the T9-T10 ligamentum flavum. I then carefully dissected in the epidural space. I used the trialer to confirm that the lesions would fit in the epidural space. Under fluoroscopic guidance I inserted the leads to approximately T8 in the midline. I secured the leads at the spinal axis site with a silk suture. I then made a separate incision between the patient's rib cage  and iliac crest on the left. I created a subcutaneous tunnel using Metzenbaum scissors. We then used the shunt passer to run the wires from the thoracic incision to the generator incision. I connected the wires to the stimulator. We confirmed that the connections were good. I secured the wires by tightening the nuts. I placed the generator in the subcutaneous pocket. I then removed the retractor. I reapproximated patient's thoraco lumbar fascia with interrupted 0 Vicryl suture. I reapproximated the subcutaneous tissue in the thoracic incision and the flank incision using 2-0 Vicryl suture. We then reapproximated the skin with Steri-Strips and benzoin. The wounds were then coated with bacitracin ointment. A sterile dressing was applied. The drapes were removed. By report all sponge, insulin, and needle counts were correct at the end of this case.

## 2015-11-06 NOTE — H&P (Signed)
Subjective: The patient is a 55 year old white female who has had a previous lumbar fusion. The patient has had chronic back pain. She has had a spinal cord stimulator trial which was successful. We discussed placement of a permanent stimulator. The patient has decided to proceed with surgery .  Past Medical History  Diagnosis Date  . Fibromyalgia   . Degenerative disc disease     LS spine discectomy and fusion; parasternal rod  . Carpal tunnel syndrome   . Hyperlipidemia   . Ulnar neuritis   . Tobacco abuse     40 pack years; one pack per day  . Gastroesophageal reflux disease   . COPD (chronic obstructive pulmonary disease) (HCC)   . Neuropathy (HCC)     ambulates with cane  . Chronic neck pain   . Chronic back pain   . Shortness of breath   . PSVT (paroxysmal supraventricular tachycardia) (HCC)     normal coronary angiography in 2006; records from EMS documented supraventricular tachycardia at approximately 195 bpm on 05/10/10  . Pancreatitis   . History of bronchitis   . History of hiatal hernia   . Anemia     history in the past    Past Surgical History  Procedure Laterality Date  . Lumbar disc surgery  07/2010    fusion  . Ganglion cyst excision  2009    Right wrist  . Cesarean section  1995  . Tubal ligation  1995  . Carpal tunnel release  2009    Right  . Spinal fixation surgery w/ implant      L4, L5  . Back surgery    . Lumbar laminectomy/decompression microdiscectomy Left 09/14/2012    Procedure: LUMBAR LAMINECTOMY/DECOMPRESSION MICRODISCECTOMY 1 LEVEL;  Surgeon: Cristi Loron, MD;  Location: [facility];  Service: Neurosurgery;  Laterality: Left;  LEFT Lumbar three-four diskectomy  . Esophagogastroduodenoscopy (egd) with propofol N/A 03/06/2015    Procedure: ESOPHAGOGASTRODUODENOSCOPY (EGD) WITH PROPOFOL;  Surgeon: Corbin Ade, MD;  Location: [facility];  Service: Endoscopy;  Laterality: N/A;  . Biopsy  03/06/2015    RMR: Gastric and duodenal erosions status  post gastric biopsy. Chroniclly elevated serum lipase  . Eus N/A 07/24/2015    Procedure: UPPER ENDOSCOPIC ULTRASOUND (EUS) LINEAR;  Surgeon: Rachael Fee, MD;  Location: [facility];  Service: Endoscopy;  Laterality: N/A;    Allergies  Allergen Reactions  . Other Other (See Comments)    Pt. Refuses all blood products  . Sulfonamide Derivatives Anaphylaxis and Rash    Social History  Substance Use Topics  . Smoking status: Former Smoker -- 1.00 packs/day for 43 years    Types: Cigarettes    Quit date: 12/31/2014  . Smokeless tobacco: Never Used     Comment: stopped smoking around June 2016, now uses VAPES  . Alcohol Use: No    Family History  Problem Relation Age of Onset  . Arthritis Other   . Stroke Other   . Cancer Mother   . Diabetes Mother   . Hypertension Mother   . Thyroid disease Mother   . Diabetes Brother   . Hypertension Brother   . Colon cancer Neg Hx    Prior to Admission medications   Medication Sig Start Date End Date Taking? Authorizing Provider  Aspirin-Salicylamide-Caffeine (BC HEADACHE POWDER PO) Take 1 packet by mouth every 6 (six) hours as needed (migraine headache).   Yes Historical Provider, MD  dexlansoprazole (DEXILANT) 60 MG capsule Take 1 capsule (60 mg  total) by mouth daily. 06/24/15  Yes Anice Paganini, NP  DULoxetine (CYMBALTA) 30 MG capsule Take 30 mg by mouth 2 (two) times daily.  06/11/13  Yes Historical Provider, MD  gabapentin (NEURONTIN) 300 MG capsule Take 300 mg by mouth 3 (three) times daily.  06/11/13  Yes Historical Provider, MD  ondansetron (ZOFRAN) 4 MG tablet TAKE ONE TABLET BY MOUTH EVERY 8 HOURS AS NEEDED FOR NAUSEA OR VOMITING. 07/18/15  Yes Nira Retort, NP  oxyCODONE-acetaminophen (PERCOCET/ROXICET) 5-325 MG tablet Take 1 tablet by mouth every 6 (six) hours as needed for moderate pain.    Yes Historical Provider, MD  promethazine (PHENERGAN) 25 MG tablet TAKE 1/2 TAB EVERY 6 HOURS AS NEEDED FOR NAUSEA Patient taking differently: Take  12.5 mg by mouth every 6 (six) hours as needed for nausea. TAKE 1/2 TAB EVERY 6 HOURS AS NEEDED FOR NAUSEA 08/15/15  Yes Tiffany Kocher, PA-C  tiZANidine (ZANAFLEX) 2 MG tablet Take 4 mg by mouth at bedtime.   Yes Historical Provider, MD     Review of Systems  Positive ROS: As above  All other systems have been reviewed and were otherwise negative with the exception of those mentioned in the HPI and as above.  Objective: Vital signs in last 24 hours: Temp:  [98.2 F (36.8 C)] 98.2 F (36.8 C) (06/01 0903) Pulse Rate:  [71] 71 (06/01 0903) Resp:  [18] 18 (06/01 0903) BP: (114)/(66) 114/66 mmHg (06/01 0903) SpO2:  [95 %] 95 % (06/01 0903) Weight:  [90.719 kg (200 lb)] 90.719 kg (200 lb) (06/01 0856)  General Appearance: Alert, cooperative, no distress, Head: Normocephalic, without obvious abnormality, atraumatic Eyes: PERRL, conjunctiva/corneas clear, EOM's intact,    Ears: Normal  Throat: Normal  Neck: Supple, symmetrical, trachea midline, no adenopathy; thyroid: No enlargement/tenderness/nodules; no carotid bruit or JVD Back: Symmetric, no curvature, ROM normal, no CVA tenderness. The patient's lumbar incision is well-healed. Lungs: Clear to auscultation bilaterally, respirations unlabored Heart: Regular rate and rhythm, no murmur, rub or gallop Abdomen: Soft, non-tender,, no masses, no organomegaly Extremities: Extremities normal, atraumatic, no cyanosis or edema Pulses: 2+ and symmetric all extremities Skin: Skin color, texture, turgor normal, no rashes or lesions  NEUROLOGIC:   Mental status: alert and oriented, no aphasia, good attention span, Fund of knowledge/ memory ok Motor Exam - grossly normal Sensory Exam - grossly normal Reflexes:  Coordination - grossly normal Gait - grossly normal Balance - grossly normal Cranial Nerves: I: smell Not tested  II: visual acuity  OS: Normal  OD: Normal   II: visual fields Full to confrontation  II: pupils Equal, round,  reactive to light  III,VII: ptosis None  III,IV,VI: extraocular muscles  Full ROM  V: mastication Normal  V: facial light touch sensation  Normal  V,VII: corneal reflex  Present  VII: facial muscle function - upper  Normal  VII: facial muscle function - lower Normal  VIII: hearing Not tested  IX: soft palate elevation  Normal  IX,X: gag reflex Present  XI: trapezius strength  5/5  XI: sternocleidomastoid strength 5/5  XI: neck flexion strength  5/5  XII: tongue strength  Normal    Data Review Lab Results  Component Value Date   WBC 8.0 10/29/2015   HGB 13.5 10/29/2015   HCT 42.3 10/29/2015   MCV 95.1 10/29/2015   PLT 210 10/29/2015   Lab Results  Component Value Date   NA 138 10/29/2015   K 4.2 10/29/2015   CL 103 10/29/2015  CO2 27 10/29/2015   BUN 12 10/29/2015   CREATININE 0.68 10/29/2015   GLUCOSE 116* 10/29/2015   No results found for: INR, PROTIME  Assessment/Plan: Chronic lumbago: I have discussed the situation with the patient. We have discussed the various treatment options including placement of a permanent spinal cord stimulator. I have described this procedure to her. We have discussed the risks, benefits, alternatives, and likelihood of achieving her goals with surgery. I have answered all her questions. She has decided proceed with surgery.   Christabelle Hanzlik D 11/06/2015 10:05 AM

## 2015-11-06 NOTE — Anesthesia Procedure Notes (Signed)
Procedure Name: Intubation Date/Time: 11/06/2015 10:17 AM Performed by: Coralee RudFLORES, Sarayah Bacchi Pre-anesthesia Checklist: Patient identified, Emergency Drugs available, Suction available and Patient being monitored Patient Re-evaluated:Patient Re-evaluated prior to inductionOxygen Delivery Method: Circle system utilized Preoxygenation: Pre-oxygenation with 100% oxygen Intubation Type: IV induction Ventilation: Mask ventilation without difficulty Laryngoscope Size: Miller and 3 Grade View: Grade I Tube type: Oral Tube size: 7.5 mm Number of attempts: 1 Airway Equipment and Method: Stylet Placement Confirmation: ETT inserted through vocal cords under direct vision,  positive ETCO2 and breath sounds checked- equal and bilateral Secured at: 22 cm Tube secured with: Tape Dental Injury: Teeth and Oropharynx as per pre-operative assessment

## 2015-11-06 NOTE — Anesthesia Postprocedure Evaluation (Signed)
Anesthesia Post Note  Patient: Susan Potts  Procedure(s) Performed: Procedure(s) (LRB): LUMBAR SPINAL CORD STIMULATOR INSERTION (N/A)  Patient location during evaluation: PACU Anesthesia Type: General Level of consciousness: awake and alert and oriented Pain management: pain level controlled Vital Signs Assessment: post-procedure vital signs reviewed and stable Respiratory status: spontaneous breathing, nonlabored ventilation and respiratory function stable Cardiovascular status: blood pressure returned to baseline and stable Postop Assessment: no signs of nausea or vomiting Anesthetic complications: no    Last Vitals:  Filed Vitals:   11/06/15 1242 11/06/15 1245  BP: 121/83   Pulse: 67 69  Temp:    Resp: 12 17    Last Pain:  Filed Vitals:   11/06/15 1252  PainSc: 10-Worst pain ever                 Mahin Guardia A.

## 2015-11-06 NOTE — Transfer of Care (Signed)
Immediate Anesthesia Transfer of Care Note  Patient: Susan Potts  Procedure(s) Performed: Procedure(s) with comments: LUMBAR SPINAL CORD STIMULATOR INSERTION (N/A) - LUMBAR SPINAL CORD STIMULATOR INSERTION  Patient Location: PACU  Anesthesia Type:General  Level of Consciousness: awake, alert , oriented and patient cooperative  Airway & Oxygen Therapy: Patient Spontanous Breathing and Patient connected to nasal cannula oxygen  Post-op Assessment: Report given to RN, Post -op Vital signs reviewed and stable and Patient moving all extremities  Post vital signs: Reviewed and stable  Last Vitals:  Filed Vitals:   11/06/15 0903  BP: 114/66  Pulse: 71  Temp: 36.8 C  Resp: 18    Last Pain:  Filed Vitals:   11/06/15 1217  PainSc: 8       Patients Stated Pain Goal: 3 (11/06/15 0856)  Complications: No apparent anesthesia complications

## 2015-11-06 NOTE — Progress Notes (Signed)
Subjective:  The patient is somnolent but easily arousable. She looks well.  Objective: Vital signs in last 24 hours: Temp:  [97.9 F (36.6 C)-98.2 F (36.8 C)] 97.9 F (36.6 C) (06/01 1212) Pulse Rate:  [67-88] 69 (06/01 1245) Resp:  [10-18] 17 (06/01 1245) BP: (114-145)/(66-85) 121/83 mmHg (06/01 1242) SpO2:  [95 %-99 %] 98 % (06/01 1245) Weight:  [90.719 kg (200 lb)] 90.719 kg (200 lb) (06/01 0856)  Intake/Output from previous day:   Intake/Output this shift: Total I/O In: 1100 [I.V.:1100] Out: 10 [Blood:10]  Physical exam the patient is, but arousable. She is moving her lower extremities well.  Lab Results: No results for input(s): WBC, HGB, HCT, PLT in the last 72 hours. BMET No results for input(s): NA, K, CL, CO2, GLUCOSE, BUN, CREATININE, CALCIUM in the last 72 hours.  Studies/Results: No results found.  Assessment/Plan: The patient is doing well.      Shunsuke Granzow D 11/06/2015, 12:55 PM

## 2015-11-07 ENCOUNTER — Encounter (HOSPITAL_COMMUNITY): Payer: Self-pay | Admitting: Neurosurgery

## 2015-11-07 DIAGNOSIS — M797 Fibromyalgia: Secondary | ICD-10-CM | POA: Diagnosis not present

## 2015-11-07 DIAGNOSIS — G8929 Other chronic pain: Secondary | ICD-10-CM | POA: Diagnosis not present

## 2015-11-07 DIAGNOSIS — M545 Low back pain: Secondary | ICD-10-CM | POA: Diagnosis not present

## 2015-11-07 DIAGNOSIS — K219 Gastro-esophageal reflux disease without esophagitis: Secondary | ICD-10-CM | POA: Diagnosis not present

## 2015-11-07 DIAGNOSIS — Z981 Arthrodesis status: Secondary | ICD-10-CM | POA: Diagnosis not present

## 2015-11-07 DIAGNOSIS — G629 Polyneuropathy, unspecified: Secondary | ICD-10-CM | POA: Diagnosis not present

## 2015-11-07 MED ORDER — DOCUSATE SODIUM 100 MG PO CAPS: 100.0000 mg | ORAL_CAPSULE | Freq: Two times a day (BID) | ORAL | Status: DC

## 2015-11-07 MED ORDER — OXYCODONE-ACETAMINOPHEN 5-325 MG PO TABS: 1.0000 | ORAL_TABLET | ORAL | Status: DC | PRN

## 2015-11-07 MED ORDER — CYCLOBENZAPRINE HCL 10 MG PO TABS: 10.0000 mg | ORAL_TABLET | Freq: Three times a day (TID) | ORAL | Status: DC | PRN

## 2015-11-07 NOTE — Discharge Instructions (Signed)

## 2015-11-07 NOTE — Progress Notes (Signed)
Patient alert and oriented, mae's well, voiding adequate amount of urine, swallowing without difficulty, c/o mild pain. Patient discharged home with family. Script and discharged instructions given to patient. Patient and family stated understanding of d/c instructions given and has an appointment with MD.   

## 2015-11-07 NOTE — Discharge Summary (Signed)
  Physician Discharge Summary  Patient ID: [name] MRN: [id number] DOB/AGE: 57/08/1960 57 y.o.  Admit date: 11/06/2015 Discharge date: 11/07/2015  Admission Diagnoses:Chronic back pain  Discharge Diagnoses: The same Active Problems:   Lumbago   Discharged Condition: good  Hospital Course: I placed a spinal cord stimulator in the patient on 11/06/2015. The surgery went well.  The patient's postoperative course was unremarkable. On postoperative day #1 she requested  discharge to home. The patient was given written and oral discharge instructions. All her questions were answered.  Consults: None Significant Diagnostic Studies: None Treatments: Placement of [employer] spinal cord stimulator at T8 Discharge Exam: Blood pressure 94/61, pulse 52, temperature 97.9 F (36.6 C), temperature source Oral, resp. rate 18, weight 90.719 kg (200 lb), SpO2 94 %. The patient is alert and pleasant. Her strength is normal in her lower extremities. Her dressings are dry. She looks well.  Disposition: Home     Medication List    TAKE these medications        BC HEADACHE POWDER PO  Take 1 packet by mouth every 6 (six) hours as needed (migraine headache).     cyclobenzaprine 10 MG tablet  Commonly known as:  FLEXERIL  Take 1 tablet (10 mg total) by mouth 3 (three) times daily as needed.     dexlansoprazole 60 MG capsule  Commonly known as:  DEXILANT  Take 1 capsule (60 mg total) by mouth daily.     docusate sodium 100 MG capsule  Commonly known as:  COLACE  Take 1 capsule (100 mg total) by mouth 2 (two) times daily.     DULoxetine 30 MG capsule  Commonly known as:  CYMBALTA  Take 30 mg by mouth 2 (two) times daily.     gabapentin 300 MG capsule  Commonly known as:  NEURONTIN  Take 300 mg by mouth 3 (three) times daily.     ondansetron 4 MG tablet  Commonly known as:  ZOFRAN  TAKE ONE TABLET BY MOUTH EVERY 8 HOURS AS NEEDED FOR NAUSEA OR VOMITING.     oxyCODONE-acetaminophen 5-325 MG tablet  Commonly known as:  PERCOCET/ROXICET  Take 1-2 tablets by mouth every 4 (four) hours as needed for moderate pain.     promethazine 25 MG tablet  Commonly known as:  PHENERGAN  TAKE 1/2 TAB EVERY 6 HOURS AS NEEDED FOR NAUSEA     tiZANidine 2 MG tablet  Commonly known as:  ZANAFLEX  Take 4 mg by mouth at bedtime.         SignedTressie Stalker: Yousuf Ager D 11/07/2015, 7:06 AM

## 2016-05-03 ENCOUNTER — Other Ambulatory Visit: Payer: Self-pay | Admitting: Gastroenterology

## 2016-05-05 ENCOUNTER — Other Ambulatory Visit: Payer: Self-pay | Admitting: Internal Medicine

## 2016-07-09 DIAGNOSIS — M961 Postlaminectomy syndrome, not elsewhere classified: Secondary | ICD-10-CM | POA: Diagnosis not present

## 2016-07-09 DIAGNOSIS — G5603 Carpal tunnel syndrome, bilateral upper limbs: Secondary | ICD-10-CM | POA: Diagnosis not present

## 2016-07-09 DIAGNOSIS — G629 Polyneuropathy, unspecified: Secondary | ICD-10-CM | POA: Diagnosis not present

## 2016-08-12 DIAGNOSIS — R030 Elevated blood-pressure reading, without diagnosis of hypertension: Secondary | ICD-10-CM | POA: Diagnosis not present

## 2016-08-12 DIAGNOSIS — Z6833 Body mass index (BMI) 33.0-33.9, adult: Secondary | ICD-10-CM | POA: Diagnosis not present

## 2016-08-12 DIAGNOSIS — G5603 Carpal tunnel syndrome, bilateral upper limbs: Secondary | ICD-10-CM | POA: Diagnosis not present

## 2016-10-11 DIAGNOSIS — G5602 Carpal tunnel syndrome, left upper limb: Secondary | ICD-10-CM | POA: Diagnosis not present

## 2016-10-11 DIAGNOSIS — G5601 Carpal tunnel syndrome, right upper limb: Secondary | ICD-10-CM | POA: Diagnosis not present

## 2016-10-11 DIAGNOSIS — M654 Radial styloid tenosynovitis [de Quervain]: Secondary | ICD-10-CM | POA: Diagnosis not present

## 2017-02-28 DIAGNOSIS — M654 Radial styloid tenosynovitis [de Quervain]: Secondary | ICD-10-CM | POA: Diagnosis not present

## 2017-03-08 ENCOUNTER — Other Ambulatory Visit: Payer: Self-pay | Admitting: Gastroenterology

## 2017-03-11 ENCOUNTER — Ambulatory Visit (INDEPENDENT_AMBULATORY_CARE_PROVIDER_SITE_OTHER): Payer: Medicare Other | Admitting: Nurse Practitioner

## 2017-03-11 ENCOUNTER — Encounter: Payer: Self-pay | Admitting: Nurse Practitioner

## 2017-03-11 VITALS — BP 113/72 | HR 78 | Temp 97.5°F | Ht 67.0 in | Wt 189.6 lb

## 2017-03-11 DIAGNOSIS — K219 Gastro-esophageal reflux disease without esophagitis: Secondary | ICD-10-CM

## 2017-03-11 DIAGNOSIS — R110 Nausea: Secondary | ICD-10-CM | POA: Diagnosis not present

## 2017-03-11 DIAGNOSIS — R748 Abnormal levels of other serum enzymes: Secondary | ICD-10-CM

## 2017-03-11 DIAGNOSIS — R109 Unspecified abdominal pain: Secondary | ICD-10-CM | POA: Diagnosis not present

## 2017-03-11 MED ORDER — DEXLANSOPRAZOLE 60 MG PO CPDR: 60.0000 mg | DELAYED_RELEASE_CAPSULE | Freq: Every day | ORAL | 3 refills | Status: DC

## 2017-03-11 MED ORDER — PROMETHAZINE HCL 25 MG PO TABS: ORAL_TABLET | ORAL | 1 refills | Status: DC

## 2017-03-11 MED ORDER — ONDANSETRON HCL 4 MG PO TABS: ORAL_TABLET | ORAL | 1 refills | Status: DC

## 2017-03-11 NOTE — Progress Notes (Signed)
cc'ed to pcp °

## 2017-03-11 NOTE — Assessment & Plan Note (Signed)
Nausea has worsened. She states Zofran and Phenergan typically help. She has run out of both of these. I will refill her Zofran and Phenergan for her. Return for follow-up in 3 months for further evaluation and follow-up.

## 2017-03-11 NOTE — Progress Notes (Signed)
Referring Provider: No ref. provider found Primary Care Physician:  Ernestina Penna, MD Primary GI:  Dr. Jena Gauss  Chief Complaint  Patient presents with  . Abdominal Pain  . Nausea  . Weight Loss    per pt-lost 29 lbs in past month    HPI:   Susan Potts is a 56 y.o. female who presents for follow-up on abdominal pain, nausea. The patient also indicates weight loss of 29 pounds in the past month. She was last seen in our office 06/24/2015. History of chronically elevated serum lipase. Recommended endoscopic ultrasound to further evaluate her pancreas. She canceled her EUS. No CT changes of pancreatitis in June 2016. Repeat CT 01/06/2015 showed persistently normal pancreas, possibly subclinical pancreatitis.  At her last visit she was still having left upper quadrant abdominal pain. As of her last visit she had not called to reschedule her EUS because she was "unsure of how to contact them." States her pain is worse after eating, only eats one meal a day, noted bloating along with nausea. Recommended stop Prilosec, start Dexilant, labs including CBC, CMP, lipase. Recommended she get back in touch with Dr. Christella Hartigan for endoscopic ultrasound and contact information for them was provided to her.  Endoscopic ultrasound by Dr. Christella Hartigan down in [place] was reviewed. Noted to be normal exam with doubtful bouts of acute pancreatitis given repeated normal appearance of her pancreas on imaging. Recommended to observe clinically.  Review of her labs ordered at last visit show normal CBC, normal CMP, lipase back to normal at 50.  She was a no show for her last appointment with [place].  Today she states Dexilant was working well but ran out. She is also out of Zofran and Phenergan both of which work well for her. Abdominal pain left-sided mid-abdomen. This was much better on Dexilant. Denies hematochezia, melena. Denies chest pain, dyspnea, dizziness, lightheadedness, syncope, near syncope. Denies any  other upper or lower GI symptoms.  Objectively her weight in January 2017 was 190 pounds. Today she is 189 pounds. No objective weight loss noted.    Past Medical History:  Diagnosis Date  . Anemia    history in the past  . Carpal tunnel syndrome   . Chronic back pain   . Chronic neck pain   . COPD (chronic obstructive pulmonary disease) (HCC)   . Degenerative disc disease    LS spine discectomy and fusion; parasternal rod  . Fibromyalgia   . Gastroesophageal reflux disease   . History of bronchitis   . History of hiatal hernia   . Hyperlipidemia   . Neuropathy    ambulates with cane  . Pancreatitis   . PSVT (paroxysmal supraventricular tachycardia) (HCC)    normal coronary angiography in 2006; records from EMS documented supraventricular tachycardia at approximately 195 bpm on 05/10/10  . Shortness of breath   . Tobacco abuse    40 pack years; one pack per day  . Ulnar neuritis     Past Surgical History:  Procedure Laterality Date  . BACK SURGERY    . BIOPSY  03/06/2015   RMR: Gastric and duodenal erosions status post gastric biopsy. Chroniclly elevated serum lipase  . CARPAL TUNNEL RELEASE  2009   Right  . CESAREAN SECTION  1995  . ESOPHAGOGASTRODUODENOSCOPY (EGD) WITH PROPOFOL N/A 03/06/2015   Procedure: ESOPHAGOGASTRODUODENOSCOPY (EGD) WITH PROPOFOL;  Surgeon: Corbin Ade, MD;  Location: [facility];  Service: Endoscopy;  Laterality: N/A;  . EUS N/A 07/24/2015  Procedure: UPPER ENDOSCOPIC ULTRASOUND (EUS) LINEAR;  Surgeon: Rachael Fee, MD;  Location: [facility];  Service: Endoscopy;  Laterality: N/A;  . GANGLION CYST EXCISION  2009   Right wrist  . LUMBAR DISC SURGERY  07/2010   fusion  . LUMBAR LAMINECTOMY/DECOMPRESSION MICRODISCECTOMY Left 09/14/2012   Procedure: LUMBAR LAMINECTOMY/DECOMPRESSION MICRODISCECTOMY 1 LEVEL;  Surgeon: Cristi Loron, MD;  Location: [facility];  Service: Neurosurgery;  Laterality: Left;  LEFT Lumbar three-four diskectomy  .  SPINAL CORD STIMULATOR INSERTION N/A 11/06/2015   Procedure: LUMBAR SPINAL CORD STIMULATOR INSERTION;  Surgeon: Tressie Stalker, MD;  Location: [facility];  Service: Neurosurgery;  Laterality: N/A;  LUMBAR SPINAL CORD STIMULATOR INSERTION  . SPINAL FIXATION SURGERY W/ IMPLANT     L4, L5  . TUBAL LIGATION  1995    Current Outpatient Prescriptions  Medication Sig Dispense Refill  . Aspirin-Salicylamide-Caffeine (BC HEADACHE POWDER PO) Take 1 packet by mouth every 6 (six) hours as needed (migraine headache).    . cyclobenzaprine (FLEXERIL) 10 MG tablet Take 1 tablet (10 mg total) by mouth 3 (three) times daily as needed. 50 tablet 1  . DULoxetine (CYMBALTA) 30 MG capsule Take 30 mg by mouth 2 (two) times daily.     Marland Kitchen gabapentin (NEURONTIN) 300 MG capsule Take 300 mg by mouth 3 (three) times daily.     Marland Kitchen oxyCODONE-acetaminophen (PERCOCET/ROXICET) 5-325 MG tablet Take 1-2 tablets by mouth every 4 (four) hours as needed for moderate pain. 50 tablet 0  . dexlansoprazole (DEXILANT) 60 MG capsule Take 1 capsule (60 mg total) by mouth daily. (Patient not taking: Reported on 03/11/2017) 90 capsule 3  . ondansetron (ZOFRAN) 4 MG tablet TAKE ONE TABLET BY MOUTH EVERY 8 HOURS AS NEEDED FOR NAUSEA OR VOMITING. (Patient not taking: Reported on 03/11/2017) 30 tablet 1  . promethazine (PHENERGAN) 25 MG tablet TAKE 1/2 TABLET BY MOUTH EVERY 6 HOURS AS NEEDED FOR NAUSEA. (Patient not taking: Reported on 03/11/2017) 30 tablet 1   No current facility-administered medications for this visit.     Allergies as of 03/11/2017 - Review Complete 03/11/2017  Allergen Reaction Noted  . Other Other (See Comments) 10/29/2015  . Sulfonamide derivatives Anaphylaxis and Rash     Family History  Problem Relation Age of Onset  . Arthritis Other   . Stroke Other   . Cancer Mother   . Diabetes Mother   . Hypertension Mother   . Thyroid disease Mother   . Diabetes Brother   . Hypertension Brother   . Colon cancer Neg Hx       Social History   Social History  . Marital status: Single    Spouse name: N/A  . Number of children: N/A  . Years of education: N/A   Occupational History  . Unemployed     Disabled   Social History Main Topics  . Smoking status: Current Every Day Smoker    Packs/day: 1.00    Years: 43.00    Types: E-cigarettes    Last attempt to quit: 12/31/2014  . Smokeless tobacco: Never Used     Comment: stopped smoking around June 2016, now uses VAPES  . Alcohol use No  . Drug use: No  . Sexual activity: Yes    Birth control/ protection: Surgical   Other Topics Concern  . None   Social History Narrative   Divorced.    Review of Systems: General: Negative for anorexia, weight loss, fever, chills, fatigue, weakness. ENT: Negative for hoarseness, difficulty  swallowing CV: Negative for chest pain, angina, palpitations, peripheral edema.  Respiratory: Negative for dyspnea at rest, cough, sputum, wheezing.  GI: See history of present illness. MS: Chronic pain/mobility issues.  Derm: Negative for rash or itching.  Endo: Negative for unusual weight change.  Heme: Negative for bruising or bleeding.   Physical Exam: BP 113/72   Pulse 78   Temp (!) 97.5 F (36.4 C) (Oral)   Ht  (1.702 m)   Wt 189 lb 9.6 oz (86 kg)   BMI 29.70 kg/m  General:   Alert and oriented. Pleasant and cooperative. Well-nourished and well-developed. Apparently poor hygeine.  Eyes:  Without icterus, sclera clear and conjunctiva pink.  Ears:  Normal auditory acuity. Cardiovascular:  S1, S2 present without murmurs appreciated. Extremities without clubbing or edema. Respiratory:  Clear to auscultation bilaterally. No wheezes, rales, or rhonchi. No distress.  Gastrointestinal:  +BS, soft, and non-distended. Upper abdominal TTP noted. No HSM noted. No guarding or rebound. No masses appreciated.  Rectal:  Deferred  Musculoskalatal:  Symmetrical without gross deformities. Neurologic:  Alert and oriented  x4;  grossly normal neurologically. Psych:  Alert and cooperative. Normal mood and affect. Heme/Lymph/Immune: No excessive bruising noted.    03/11/2017 11:06 AM   Disclaimer: This note was dictated with voice recognition software. Similar sounding words can inadvertently be transcribed and may not be corrected upon review.

## 2017-03-11 NOTE — Assessment & Plan Note (Signed)
Abdominal pain has worsened since she has run out of PPI. I will refill her Dexon. She states previously this worked quite well for her abdominal pain. EUS essentially unremarkable as outlined in history of present illness. Follow-up in 3 months to update clinical progress and recheck her lipase.

## 2017-03-11 NOTE — Patient Instructions (Signed)
1. I have sent in refills of Zofran, Phenergan, Dexilant. 2. Return for follow-up in 3 months. 3. At that time we will likely recheck her lipase. 4. Call if you have any questions or concerns.

## 2017-03-30 DIAGNOSIS — M65341 Trigger finger, right ring finger: Secondary | ICD-10-CM | POA: Diagnosis not present

## 2017-05-18 DIAGNOSIS — J441 Chronic obstructive pulmonary disease with (acute) exacerbation: Secondary | ICD-10-CM | POA: Diagnosis not present

## 2017-06-14 ENCOUNTER — Ambulatory Visit: Payer: Medicare Other | Admitting: Nurse Practitioner

## 2017-06-14 ENCOUNTER — Encounter: Payer: Self-pay | Admitting: Nurse Practitioner

## 2017-06-14 ENCOUNTER — Telehealth: Payer: Self-pay | Admitting: Nurse Practitioner

## 2017-06-14 NOTE — Progress Notes (Deleted)
Referring Provider: Ernestina Penna, MD Primary Care Physician:  Ernestina Penna, MD Primary GI:  Dr. Jena Gauss  No chief complaint on file.   HPI:   Susan Potts is a 57 y.o. female who presents for follow-up on abdominal pain and nausea.  The patient was last seen in our office 06/24/2015.  History of chronically elevated lipase, with a noted value of 262 on 12/25/2014 without CT evidence of pancreatitis in June 2016.  Repeat CT 01/06/2015 also showed normal pancreas on CT.  Possible subclinical pancreatitis.  Last endoscopy completed 03/06/2015 which found gastric and duodenal erosions status post biopsy, chronically elevated serum lipase. Recommended follow-up on pathology and proceed with scheduling of endoscopic ultrasound to further evaluate the pancreas. Surgical pathology showed benign gastric mucosa. The day she was scheduled for EUS she canceled.  At her last visit she noted persistent left upper quadrant pain and nausea.  Scheduled for spinal cord implant.  Noted abdominal swelling.  Only eats about 1 meal a day, pain worse after eating and stomach "really swells up" after eating along with nausea.  States Prilosec is not working and still has heartburn.  Previously tried and failed Prevacid, Zantac, Protonix.  Has not tried Nexium or Dexilant.  No other GI symptoms.  Recommended stopping Prilosec and start Dexilant daily.  Recommended CBC, CMP, lipase.  Follow-up in 3 months.  Labs are completed 07/15/2015 with a normal CBC, essentially normal CMP, normal lipase at 50.  EUS was eventually completed 07/24/2015 which found normal pancreatic parenchyma throughout the gland without discrete mass or sign of chronic pancreatitis, main pancreatic duct was normal and nondilated, CBD normal and nondilated without filling defects, no peripancreatic adenopathy, limited views of the liver, spleen, portal and splenic vessels all normal.  Overall felt to be a normal examination. Recommended to observe  clinically.  She was a no-show to her follow-up appointment.  Today she states   Past Medical History:  Diagnosis Date  . Anemia    history in the past  . Carpal tunnel syndrome   . Chronic back pain   . Chronic neck pain   . COPD (chronic obstructive pulmonary disease) (HCC)   . Degenerative disc disease    LS spine discectomy and fusion; parasternal rod  . Fibromyalgia   . Gastroesophageal reflux disease   . History of bronchitis   . History of hiatal hernia   . Hyperlipidemia   . Neuropathy    ambulates with cane  . Pancreatitis   . PSVT (paroxysmal supraventricular tachycardia) (HCC)    normal coronary angiography in 2006; records from EMS documented supraventricular tachycardia at approximately 195 bpm on 05/10/10  . Shortness of breath   . Tobacco abuse    40 pack years; one pack per day  . Ulnar neuritis     Past Surgical History:  Procedure Laterality Date  . BACK SURGERY    . BIOPSY  03/06/2015   RMR: Gastric and duodenal erosions status post gastric biopsy. Chroniclly elevated serum lipase  . CARPAL TUNNEL RELEASE  2009   Right  . CESAREAN SECTION  1995  . ESOPHAGOGASTRODUODENOSCOPY (EGD) WITH PROPOFOL N/A 03/06/2015   Procedure: ESOPHAGOGASTRODUODENOSCOPY (EGD) WITH PROPOFOL;  Surgeon: Corbin Ade, MD;  Location: [facility];  Service: Endoscopy;  Laterality: N/A;  . EUS N/A 07/24/2015   Procedure: UPPER ENDOSCOPIC ULTRASOUND (EUS) LINEAR;  Surgeon: Rachael Fee, MD;  Location: [facility];  Service: Endoscopy;  Laterality: N/A;  . GANGLION CYST EXCISION  2009   Right wrist  . LUMBAR DISC SURGERY  07/2010   fusion  . LUMBAR LAMINECTOMY/DECOMPRESSION MICRODISCECTOMY Left 09/14/2012   Procedure: LUMBAR LAMINECTOMY/DECOMPRESSION MICRODISCECTOMY 1 LEVEL;  Surgeon: Cristi LoronJeffrey D Jenkins, MD;  Location: [facility];  Service: Neurosurgery;  Laterality: Left;  LEFT Lumbar three-four diskectomy  . SPINAL CORD STIMULATOR INSERTION N/A 11/06/2015   Procedure: LUMBAR SPINAL  CORD STIMULATOR INSERTION;  Surgeon: Tressie StalkerJeffrey Jenkins, MD;  Location: [facility];  Service: Neurosurgery;  Laterality: N/A;  LUMBAR SPINAL CORD STIMULATOR INSERTION  . SPINAL FIXATION SURGERY W/ IMPLANT     L4, L5  . TUBAL LIGATION  1995    Current Outpatient Medications  Medication Sig Dispense Refill  . Aspirin-Salicylamide-Caffeine (BC HEADACHE POWDER PO) Take 1 packet by mouth every 6 (six) hours as needed (migraine headache).    . cyclobenzaprine (FLEXERIL) 10 MG tablet Take 1 tablet (10 mg total) by mouth 3 (three) times daily as needed. 50 tablet 1  . dexlansoprazole (DEXILANT) 60 MG capsule Take 1 capsule (60 mg total) by mouth daily. 90 capsule 3  . DULoxetine (CYMBALTA) 30 MG capsule Take 30 mg by mouth 2 (two) times daily.     Marland Kitchen. gabapentin (NEURONTIN) 300 MG capsule Take 300 mg by mouth 3 (three) times daily.     . ondansetron (ZOFRAN) 4 MG tablet TAKE ONE TABLET BY MOUTH EVERY 8 HOURS AS NEEDED FOR NAUSEA OR VOMITING. 30 tablet 1  . oxyCODONE-acetaminophen (PERCOCET/ROXICET) 5-325 MG tablet Take 1-2 tablets by mouth every 4 (four) hours as needed for moderate pain. 50 tablet 0  . promethazine (PHENERGAN) 25 MG tablet TAKE 1/2 TABLET BY MOUTH EVERY 6 HOURS AS NEEDED FOR NAUSEA. 30 tablet 1   No current facility-administered medications for this visit.     Allergies as of 06/14/2017 - Review Complete 03/11/2017  Allergen Reaction Noted  . Other Other (See Comments) 10/29/2015  . Sulfonamide derivatives Anaphylaxis and Rash     Family History  Problem Relation Age of Onset  . Arthritis Other   . Stroke Other   . Cancer Mother   . Diabetes Mother   . Hypertension Mother   . Thyroid disease Mother   . Diabetes Brother   . Hypertension Brother   . Colon cancer Neg Hx     Social History   Socioeconomic History  . Marital status: Single    Spouse name: Not on file  . Number of children: Not on file  . Years of education: Not on file  . Highest education level: Not  on file  Social Needs  . Financial resource strain: Not on file  . Food insecurity - worry: Not on file  . Food insecurity - inability: Not on file  . Transportation needs - medical: Not on file  . Transportation needs - non-medical: Not on file  Occupational History  . Occupation: Unemployed    Comment: Disabled  Tobacco Use  . Smoking status: Current Every Day Smoker    Packs/day: 1.00    Years: 43.00    Pack years: 43.00    Types: E-cigarettes    Last attempt to quit: 12/31/2014    Years since quitting: 2.4  . Smokeless tobacco: Never Used  . Tobacco comment: stopped smoking around June 2016, now uses VAPES  Substance and Sexual Activity  . Alcohol use: No    Alcohol/week: 0.0 oz  . Drug use: No  . Sexual activity: Yes    Birth control/protection: Surgical  Other Topics Concern  .  Not on file  Social History Narrative   Divorced.    Review of Systems: General: Negative for anorexia, weight loss, fever, chills, fatigue, weakness. Eyes: Negative for vision changes.  ENT: Negative for hoarseness, difficulty swallowing , nasal congestion. CV: Negative for chest pain, angina, palpitations, dyspnea on exertion, peripheral edema.  Respiratory: Negative for dyspnea at rest, dyspnea on exertion, cough, sputum, wheezing.  GI: See history of present illness. GU:  Negative for dysuria, hematuria, urinary incontinence, urinary frequency, nocturnal urination.  MS: Negative for joint pain, low back pain.  Derm: Negative for rash or itching.  Neuro: Negative for weakness, abnormal sensation, seizure, frequent headaches, memory loss, confusion.  Psych: Negative for anxiety, depression, suicidal ideation, hallucinations.  Endo: Negative for unusual weight change.  Heme: Negative for bruising or bleeding. Allergy: Negative for rash or hives.   Physical Exam: There were no vitals taken for this visit. General:   Alert and oriented. Pleasant and cooperative. Well-nourished and  well-developed.  Head:  Normocephalic and atraumatic. Eyes:  Without icterus, sclera clear and conjunctiva pink.  Ears:  Normal auditory acuity. Mouth:  No deformity or lesions, oral mucosa pink.  Throat/Neck:  Supple, without mass or thyromegaly. Cardiovascular:  S1, S2 present without murmurs appreciated. Normal pulses noted. Extremities without clubbing or edema. Respiratory:  Clear to auscultation bilaterally. No wheezes, rales, or rhonchi. No distress.  Gastrointestinal:  +BS, soft, non-tender and non-distended. No HSM noted. No guarding or rebound. No masses appreciated.  Rectal:  Deferred  Musculoskalatal:  Symmetrical without gross deformities. Normal posture. Skin:  Intact without significant lesions or rashes. Neurologic:  Alert and oriented x4;  grossly normal neurologically. Psych:  Alert and cooperative. Normal mood and affect. Heme/Lymph/Immune: No significant cervical adenopathy. No excessive bruising noted.    06/14/2017 9:47 AM   Disclaimer: This note was dictated with voice recognition software. Similar sounding words can inadvertently be transcribed and may not be corrected upon review.

## 2017-06-14 NOTE — Telephone Encounter (Signed)
PATIENT WAS A NO SHOW AND LETTER SENT  °

## 2017-06-14 NOTE — Telephone Encounter (Signed)
Noted  

## 2017-07-01 ENCOUNTER — Telehealth: Payer: Self-pay

## 2017-07-01 DIAGNOSIS — K219 Gastro-esophageal reflux disease without esophagitis: Secondary | ICD-10-CM

## 2017-07-01 DIAGNOSIS — R109 Unspecified abdominal pain: Secondary | ICD-10-CM

## 2017-07-01 DIAGNOSIS — R110 Nausea: Secondary | ICD-10-CM

## 2017-07-01 DIAGNOSIS — R748 Abnormal levels of other serum enzymes: Secondary | ICD-10-CM

## 2017-07-01 MED ORDER — ONDANSETRON HCL 4 MG PO TABS: ORAL_TABLET | ORAL | 1 refills | Status: DC

## 2017-07-01 MED ORDER — PROMETHAZINE HCL 25 MG PO TABS: ORAL_TABLET | ORAL | 1 refills | Status: DC

## 2017-07-01 NOTE — Telephone Encounter (Signed)
Completed.

## 2017-07-01 NOTE — Telephone Encounter (Signed)
Pt missed her appointment and is r/s for 07/05/17 with EG. Pt would like a refill of Zofran and Phenergan until her apt. Last ov 03/11/17

## 2017-07-01 NOTE — Addendum Note (Signed)
Addended by: Gelene MinkBOONE, Gabryelle Whitmoyer W on: 07/01/2017 11:55 AM   Modules accepted: Orders

## 2017-07-05 ENCOUNTER — Ambulatory Visit: Payer: Medicare Other | Admitting: Nurse Practitioner

## 2017-08-05 DIAGNOSIS — S76312A Strain of muscle, fascia and tendon of the posterior muscle group at thigh level, left thigh, initial encounter: Secondary | ICD-10-CM | POA: Diagnosis not present

## 2017-08-05 DIAGNOSIS — M76899 Other specified enthesopathies of unspecified lower limb, excluding foot: Secondary | ICD-10-CM | POA: Insufficient documentation

## 2017-08-05 DIAGNOSIS — M1712 Unilateral primary osteoarthritis, left knee: Secondary | ICD-10-CM | POA: Diagnosis not present

## 2017-08-19 ENCOUNTER — Encounter: Payer: Self-pay | Admitting: Nurse Practitioner

## 2017-08-19 ENCOUNTER — Ambulatory Visit: Payer: Medicare Other | Admitting: Nurse Practitioner

## 2017-09-22 ENCOUNTER — Other Ambulatory Visit: Payer: Self-pay | Admitting: Gastroenterology

## 2017-09-22 DIAGNOSIS — R109 Unspecified abdominal pain: Secondary | ICD-10-CM

## 2017-09-22 DIAGNOSIS — R110 Nausea: Secondary | ICD-10-CM

## 2017-09-22 DIAGNOSIS — R748 Abnormal levels of other serum enzymes: Secondary | ICD-10-CM

## 2017-09-22 DIAGNOSIS — K219 Gastro-esophageal reflux disease without esophagitis: Secondary | ICD-10-CM

## 2017-10-19 ENCOUNTER — Other Ambulatory Visit: Payer: Self-pay | Admitting: Gastroenterology

## 2017-10-19 DIAGNOSIS — R110 Nausea: Secondary | ICD-10-CM

## 2017-10-19 DIAGNOSIS — K219 Gastro-esophageal reflux disease without esophagitis: Secondary | ICD-10-CM

## 2017-10-19 DIAGNOSIS — R109 Unspecified abdominal pain: Secondary | ICD-10-CM

## 2017-10-19 DIAGNOSIS — R748 Abnormal levels of other serum enzymes: Secondary | ICD-10-CM

## 2017-12-05 ENCOUNTER — Other Ambulatory Visit: Payer: Self-pay | Admitting: Gastroenterology

## 2017-12-05 DIAGNOSIS — K219 Gastro-esophageal reflux disease without esophagitis: Secondary | ICD-10-CM

## 2017-12-05 DIAGNOSIS — R110 Nausea: Secondary | ICD-10-CM

## 2017-12-05 DIAGNOSIS — R109 Unspecified abdominal pain: Secondary | ICD-10-CM

## 2017-12-05 DIAGNOSIS — R748 Abnormal levels of other serum enzymes: Secondary | ICD-10-CM

## 2017-12-07 DIAGNOSIS — M79605 Pain in left leg: Secondary | ICD-10-CM | POA: Diagnosis not present

## 2017-12-12 DIAGNOSIS — M79605 Pain in left leg: Secondary | ICD-10-CM | POA: Diagnosis not present

## 2017-12-20 ENCOUNTER — Ambulatory Visit: Payer: Medicare Other | Admitting: Nurse Practitioner

## 2017-12-20 ENCOUNTER — Encounter: Payer: Self-pay | Admitting: Nurse Practitioner

## 2017-12-20 NOTE — Progress Notes (Deleted)
Referring Provider: Ernestina Penna, MD Primary Care Physician:  Ernestina Penna, MD Primary GI:  Dr. Jena Gauss  No chief complaint on file.   HPI:   Susan Potts is a 57 y.o. female who presents for follow-up on abdominal pain and nausea.  The patient was last seen in our office 03/11/2017 for the same as well as GERD and elevated lipase.  Query history of 29 pound weight loss at a previous visit.  Chronically elevated serum lipase with recommended EUS to further evaluate.  No CT changes of pancreatitis in June 2016.  Repeat CT 01/06/2015 showed persistent normal pancreas, possibly subclinical pancreatitis.  She later indicated to cancel the EUS because she was "unsure how to contact them."  EUS was completed by Dr. Christella Hartigan in [place] and noted to be a normal exam with doubtful bouts of acute pancreatitis given repeatedly normal appearance of her pancreas on imaging.  Recommended clinical observation.  At her last appointment she was doing okay.  Her lipase previously reviewed was normal at 50.  She started taking Dexilant which is working well but she ran out.  Also out of Zofran and Phenergan both of which work well for her.  Abdominal pain left-sided mid abdomen and much better on Dexilant.  No other GI complaints.  Objectively her weight was stable compared to a year and a half prior.  No updated lipase since 07/15/2015.  Today she states   Past Medical History:  Diagnosis Date  . Anemia    history in the past  . Carpal tunnel syndrome   . Chronic back pain   . Chronic neck pain   . COPD (chronic obstructive pulmonary disease) (HCC)   . Degenerative disc disease    LS spine discectomy and fusion; parasternal rod  . Fibromyalgia   . Gastroesophageal reflux disease   . History of bronchitis   . History of hiatal hernia   . Hyperlipidemia   . Neuropathy    ambulates with cane  . Pancreatitis   . PSVT (paroxysmal supraventricular tachycardia) (HCC)    normal coronary angiography  in 2006; records from EMS documented supraventricular tachycardia at approximately 195 bpm on 05/10/10  . Shortness of breath   . Tobacco abuse    40 pack years; one pack per day  . Ulnar neuritis     Past Surgical History:  Procedure Laterality Date  . BACK SURGERY    . BIOPSY  03/06/2015   RMR: Gastric and duodenal erosions status post gastric biopsy. Chroniclly elevated serum lipase  . CARPAL TUNNEL RELEASE  2009   Right  . CESAREAN SECTION  1995  . ESOPHAGOGASTRODUODENOSCOPY (EGD) WITH PROPOFOL N/A 03/06/2015   Procedure: ESOPHAGOGASTRODUODENOSCOPY (EGD) WITH PROPOFOL;  Surgeon: Corbin Ade, MD;  Location: [facility];  Service: Endoscopy;  Laterality: N/A;  . EUS N/A 07/24/2015   Procedure: UPPER ENDOSCOPIC ULTRASOUND (EUS) LINEAR;  Surgeon: Rachael Fee, MD;  Location: [facility];  Service: Endoscopy;  Laterality: N/A;  . GANGLION CYST EXCISION  2009   Right wrist  . LUMBAR DISC SURGERY  07/2010   fusion  . LUMBAR LAMINECTOMY/DECOMPRESSION MICRODISCECTOMY Left 09/14/2012   Procedure: LUMBAR LAMINECTOMY/DECOMPRESSION MICRODISCECTOMY 1 LEVEL;  Surgeon: Cristi Loron, MD;  Location: [facility];  Service: Neurosurgery;  Laterality: Left;  LEFT Lumbar three-four diskectomy  . SPINAL CORD STIMULATOR INSERTION N/A 11/06/2015   Procedure: LUMBAR SPINAL CORD STIMULATOR INSERTION;  Surgeon: Tressie Stalker, MD;  Location: [facility];  Service: Neurosurgery;  Laterality: N/A;  LUMBAR SPINAL CORD STIMULATOR INSERTION  . SPINAL FIXATION SURGERY W/ IMPLANT     L4, L5  . TUBAL LIGATION  1995    Current Outpatient Medications  Medication Sig Dispense Refill  . Aspirin-Salicylamide-Caffeine (BC HEADACHE POWDER PO) Take 1 packet by mouth every 6 (six) hours as needed (migraine headache).    . cyclobenzaprine (FLEXERIL) 10 MG tablet Take 1 tablet (10 mg total) by mouth 3 (three) times daily as needed. 50 tablet 1  . dexlansoprazole (DEXILANT) 60 MG capsule Take 1 capsule (60 mg total) by  mouth daily. 90 capsule 3  . DULoxetine (CYMBALTA) 30 MG capsule Take 30 mg by mouth 2 (two) times daily.     Marland Kitchen gabapentin (NEURONTIN) 300 MG capsule Take 300 mg by mouth 3 (three) times daily.     . ondansetron (ZOFRAN) 4 MG tablet TAKE ONE TABLET BY MOUTH EVERY 8 HOURS AS NEEDED FOR NAUSEA OR VOMITING. 30 tablet 1  . oxyCODONE-acetaminophen (PERCOCET/ROXICET) 5-325 MG tablet Take 1-2 tablets by mouth every 4 (four) hours as needed for moderate pain. 50 tablet 0  . promethazine (PHENERGAN) 25 MG tablet TAKE 1/2 TABLET BY MOUTH EVERY 6 HOURS AS NEEDED FOR NAUSEA. 30 tablet 1  . promethazine (PHENERGAN) 25 MG tablet TAKE 1/2 TABLET BY MOUTH EVERY 6 HOURS AS NEEDED FOR NAUSEA. 30 tablet 0  . promethazine (PHENERGAN) 25 MG tablet TAKE 1/2 TABLET BY MOUTH EVERY 6 HOURS AS NEEDED FOR NAUSEA. 30 tablet 1   No current facility-administered medications for this visit.     Allergies as of 12/20/2017 - Review Complete 03/11/2017  Allergen Reaction Noted  . Other Other (See Comments) 10/29/2015  . Sulfonamide derivatives Anaphylaxis and Rash     Family History  Problem Relation Age of Onset  . Arthritis Other   . Stroke Other   . Cancer Mother   . Diabetes Mother   . Hypertension Mother   . Thyroid disease Mother   . Diabetes Brother   . Hypertension Brother   . Colon cancer Neg Hx     Social History   Socioeconomic History  . Marital status: Single    Spouse name: Not on file  . Number of children: Not on file  . Years of education: Not on file  . Highest education level: Not on file  Occupational History  . Occupation: Unemployed    Comment: Disabled  Social Needs  . Financial resource strain: Not on file  . Food insecurity:    Worry: Not on file    Inability: Not on file  . Transportation needs:    Medical: Not on file    Non-medical: Not on file  Tobacco Use  . Smoking status: Current Every Day Smoker    Packs/day: 1.00    Years: 43.00    Pack years: 43.00    Types:  E-cigarettes    Last attempt to quit: 12/31/2014    Years since quitting: 2.9  . Smokeless tobacco: Never Used  . Tobacco comment: stopped smoking around June 2016, now uses VAPES  Substance and Sexual Activity  . Alcohol use: No    Alcohol/week: 0.0 oz  . Drug use: No  . Sexual activity: Yes    Birth control/protection: Surgical  Lifestyle  . Physical activity:    Days per week: Not on file    Minutes per session: Not on file  . Stress: Not on file  Relationships  . Social connections:    Talks on phone:  Not on file    Gets together: Not on file    Attends religious service: Not on file    Active member of club or organization: Not on file    Attends meetings of clubs or organizations: Not on file    Relationship status: Not on file  Other Topics Concern  . Not on file  Social History Narrative   Divorced.    Review of Systems: General: Negative for anorexia, weight loss, fever, chills, fatigue, weakness. Eyes: Negative for vision changes.  ENT: Negative for hoarseness, difficulty swallowing , nasal congestion. CV: Negative for chest pain, angina, palpitations, dyspnea on exertion, peripheral edema.  Respiratory: Negative for dyspnea at rest, dyspnea on exertion, cough, sputum, wheezing.  GI: See history of present illness. GU:  Negative for dysuria, hematuria, urinary incontinence, urinary frequency, nocturnal urination.  MS: Negative for joint pain, low back pain.  Derm: Negative for rash or itching.  Neuro: Negative for weakness, abnormal sensation, seizure, frequent headaches, memory loss, confusion.  Psych: Negative for anxiety, depression, suicidal ideation, hallucinations.  Endo: Negative for unusual weight change.  Heme: Negative for bruising or bleeding. Allergy: Negative for rash or hives.   Physical Exam: There were no vitals taken for this visit. General:   Alert and oriented. Pleasant and cooperative. Well-nourished and well-developed.  Head:   Normocephalic and atraumatic. Eyes:  Without icterus, sclera clear and conjunctiva pink.  Ears:  Normal auditory acuity. Mouth:  No deformity or lesions, oral mucosa pink.  Throat/Neck:  Supple, without mass or thyromegaly. Cardiovascular:  S1, S2 present without murmurs appreciated. Normal pulses noted. Extremities without clubbing or edema. Respiratory:  Clear to auscultation bilaterally. No wheezes, rales, or rhonchi. No distress.  Gastrointestinal:  +BS, soft, non-tender and non-distended. No HSM noted. No guarding or rebound. No masses appreciated.  Rectal:  Deferred  Musculoskalatal:  Symmetrical without gross deformities. Normal posture. Skin:  Intact without significant lesions or rashes. Neurologic:  Alert and oriented x4;  grossly normal neurologically. Psych:  Alert and cooperative. Normal mood and affect. Heme/Lymph/Immune: No significant cervical adenopathy. No excessive bruising noted.    12/20/2017 8:02 AM   Disclaimer: This note was dictated with voice recognition software. Similar sounding words can inadvertently be transcribed and may not be corrected upon review.

## 2018-01-02 DIAGNOSIS — M79605 Pain in left leg: Secondary | ICD-10-CM | POA: Diagnosis not present

## 2018-01-05 ENCOUNTER — Other Ambulatory Visit: Payer: Self-pay | Admitting: Gastroenterology

## 2018-01-05 DIAGNOSIS — K219 Gastro-esophageal reflux disease without esophagitis: Secondary | ICD-10-CM

## 2018-01-05 DIAGNOSIS — R110 Nausea: Secondary | ICD-10-CM

## 2018-01-05 DIAGNOSIS — R748 Abnormal levels of other serum enzymes: Secondary | ICD-10-CM

## 2018-01-05 DIAGNOSIS — R109 Unspecified abdominal pain: Secondary | ICD-10-CM

## 2018-02-10 ENCOUNTER — Encounter: Payer: Self-pay | Admitting: Nurse Practitioner

## 2018-02-10 ENCOUNTER — Ambulatory Visit: Payer: Medicare Other | Admitting: Nurse Practitioner

## 2018-02-10 ENCOUNTER — Telehealth: Payer: Self-pay | Admitting: Nurse Practitioner

## 2018-02-10 NOTE — Telephone Encounter (Signed)
PATIENT WAS A NO SHOW AND LETTER SENT  °

## 2018-02-10 NOTE — Progress Notes (Deleted)
Referring Provider: Ernestina Penna, MD Primary Care Physician:  Ernestina Penna, MD Primary GI:  Dr. Jena Gauss  No chief complaint on file.   HPI:   Susan Potts is a 57 y.o. female who presents for follow-up on abdominal pain and nausea.  The patient was last seen in our office 03/11/2017 for the same as well as GERD and elevated lipase.  Her elevated lipase is chronic.  It was previously recommended that she undergo EUS to further evaluate her pancreas but she canceled this.  CT x2 in 2016 showed persistently normal pancreas possible subclinical pancreatitis.  Scopic ultrasound was eventually rescheduled and completed.  It was noted to be normal with doubtful bouts of acute pink otitis given repeatedly normal appearance of her pancreas on imaging.  Recommended to observe clinically.  At her last visit she stated Exall is working well but she ran out.  She also ran out of Zofran and Phenergan which helped her nausea.  Abdominal pain left-sided mid abdomen and much better on Dexilant.  No other GI symptoms.  Stable weight objectively.  Refills of Zofran, Phenergan, Dexilant were sent, recommended follow-up in 3 months to recheck lipase.  She was a no-show to her follow-up visit and subsequently her medications were refilled per her phone request.  Today she states   Past Medical History:  Diagnosis Date  . Anemia    history in the past  . Carpal tunnel syndrome   . Chronic back pain   . Chronic neck pain   . COPD (chronic obstructive pulmonary disease) (HCC)   . Degenerative disc disease    LS spine discectomy and fusion; parasternal rod  . Fibromyalgia   . Gastroesophageal reflux disease   . History of bronchitis   . History of hiatal hernia   . Hyperlipidemia   . Neuropathy    ambulates with cane  . Pancreatitis   . PSVT (paroxysmal supraventricular tachycardia) (HCC)    normal coronary angiography in 2006; records from EMS documented supraventricular tachycardia at  approximately 195 bpm on 05/10/10  . Shortness of breath   . Tobacco abuse    40 pack years; one pack per day  . Ulnar neuritis     Past Surgical History:  Procedure Laterality Date  . BACK SURGERY    . BIOPSY  03/06/2015   RMR: Gastric and duodenal erosions status post gastric biopsy. Chroniclly elevated serum lipase  . CARPAL TUNNEL RELEASE  2009   Right  . CESAREAN SECTION  1995  . ESOPHAGOGASTRODUODENOSCOPY (EGD) WITH PROPOFOL N/A 03/06/2015   Procedure: ESOPHAGOGASTRODUODENOSCOPY (EGD) WITH PROPOFOL;  Surgeon: Corbin Ade, MD;  Location: [facility];  Service: Endoscopy;  Laterality: N/A;  . EUS N/A 07/24/2015   Procedure: UPPER ENDOSCOPIC ULTRASOUND (EUS) LINEAR;  Surgeon: Rachael Fee, MD;  Location: [facility];  Service: Endoscopy;  Laterality: N/A;  . GANGLION CYST EXCISION  2009   Right wrist  . LUMBAR DISC SURGERY  07/2010   fusion  . LUMBAR LAMINECTOMY/DECOMPRESSION MICRODISCECTOMY Left 09/14/2012   Procedure: LUMBAR LAMINECTOMY/DECOMPRESSION MICRODISCECTOMY 1 LEVEL;  Surgeon: Cristi Loron, MD;  Location: [facility];  Service: Neurosurgery;  Laterality: Left;  LEFT Lumbar three-four diskectomy  . SPINAL CORD STIMULATOR INSERTION N/A 11/06/2015   Procedure: LUMBAR SPINAL CORD STIMULATOR INSERTION;  Surgeon: Tressie Stalker, MD;  Location: [facility];  Service: Neurosurgery;  Laterality: N/A;  LUMBAR SPINAL CORD STIMULATOR INSERTION  . SPINAL FIXATION SURGERY W/ IMPLANT  L4, L5  . TUBAL LIGATION  1995    Current Outpatient Medications  Medication Sig Dispense Refill  . Aspirin-Salicylamide-Caffeine (BC HEADACHE POWDER PO) Take 1 packet by mouth every 6 (six) hours as needed (migraine headache).    . cyclobenzaprine (FLEXERIL) 10 MG tablet Take 1 tablet (10 mg total) by mouth 3 (three) times daily as needed. 50 tablet 1  . dexlansoprazole (DEXILANT) 60 MG capsule Take 1 capsule (60 mg total) by mouth daily. 90 capsule 3  . DULoxetine (CYMBALTA) 30 MG capsule Take  30 mg by mouth 2 (two) times daily.     Marland Kitchen gabapentin (NEURONTIN) 300 MG capsule Take 300 mg by mouth 3 (three) times daily.     . ondansetron (ZOFRAN) 4 MG tablet TAKE ONE TABLET BY MOUTH EVERY 8 HOURS AS NEEDED FOR NAUSEA OR VOMITING. 30 tablet 0  . oxyCODONE-acetaminophen (PERCOCET/ROXICET) 5-325 MG tablet Take 1-2 tablets by mouth every 4 (four) hours as needed for moderate pain. 50 tablet 0  . promethazine (PHENERGAN) 25 MG tablet TAKE 1/2 TABLET BY MOUTH EVERY 6 HOURS AS NEEDED FOR NAUSEA. 30 tablet 1  . promethazine (PHENERGAN) 25 MG tablet TAKE 1/2 TABLET BY MOUTH EVERY 6 HOURS AS NEEDED FOR NAUSEA. 30 tablet 0  . promethazine (PHENERGAN) 25 MG tablet TAKE 1/2 TABLET BY MOUTH EVERY 6 HOURS AS NEEDED FOR NAUSEA. 30 tablet 1  . promethazine (PHENERGAN) 25 MG tablet TAKE 1/2 TABLET BY MOUTH EVERY 6 HOURS AS NEEDED FOR NAUSEA. 30 tablet 0   No current facility-administered medications for this visit.     Allergies as of 02/10/2018 - Review Complete 03/11/2017  Allergen Reaction Noted  . Other Other (See Comments) 10/29/2015  . Sulfonamide derivatives Anaphylaxis and Rash     Family History  Problem Relation Age of Onset  . Arthritis Other   . Stroke Other   . Cancer Mother   . Diabetes Mother   . Hypertension Mother   . Thyroid disease Mother   . Diabetes Brother   . Hypertension Brother   . Colon cancer Neg Hx     Social History   Socioeconomic History  . Marital status: Single    Spouse name: Not on file  . Number of children: Not on file  . Years of education: Not on file  . Highest education level: Not on file  Occupational History  . Occupation: Unemployed    Comment: Disabled  Social Needs  . Financial resource strain: Not on file  . Food insecurity:    Worry: Not on file    Inability: Not on file  . Transportation needs:    Medical: Not on file    Non-medical: Not on file  Tobacco Use  . Smoking status: Current Every Day Smoker    Packs/day: 1.00     Years: 43.00    Pack years: 43.00    Types: E-cigarettes    Last attempt to quit: 12/31/2014    Years since quitting: 3.1  . Smokeless tobacco: Never Used  . Tobacco comment: stopped smoking around June 2016, now uses VAPES  Substance and Sexual Activity  . Alcohol use: No    Alcohol/week: 0.0 standard drinks  . Drug use: No  . Sexual activity: Yes    Birth control/protection: Surgical  Lifestyle  . Physical activity:    Days per week: Not on file    Minutes per session: Not on file  . Stress: Not on file  Relationships  . Social connections:  Talks on phone: Not on file    Gets together: Not on file    Attends religious service: Not on file    Active member of club or organization: Not on file    Attends meetings of clubs or organizations: Not on file    Relationship status: Not on file  Other Topics Concern  . Not on file  Social History Narrative   Divorced.    Review of Systems: General: Negative for anorexia, weight loss, fever, chills, fatigue, weakness. Eyes: Negative for vision changes.  ENT: Negative for hoarseness, difficulty swallowing , nasal congestion. CV: Negative for chest pain, angina, palpitations, dyspnea on exertion, peripheral edema.  Respiratory: Negative for dyspnea at rest, dyspnea on exertion, cough, sputum, wheezing.  GI: See history of present illness. GU:  Negative for dysuria, hematuria, urinary incontinence, urinary frequency, nocturnal urination.  MS: Negative for joint pain, low back pain.  Derm: Negative for rash or itching.  Neuro: Negative for weakness, abnormal sensation, seizure, frequent headaches, memory loss, confusion.  Psych: Negative for anxiety, depression, suicidal ideation, hallucinations.  Endo: Negative for unusual weight change.  Heme: Negative for bruising or bleeding. Allergy: Negative for rash or hives.   Physical Exam: There were no vitals taken for this visit. General:   Alert and oriented. Pleasant and  cooperative. Well-nourished and well-developed.  Head:  Normocephalic and atraumatic. Eyes:  Without icterus, sclera clear and conjunctiva pink.  Ears:  Normal auditory acuity. Mouth:  No deformity or lesions, oral mucosa pink.  Throat/Neck:  Supple, without mass or thyromegaly. Cardiovascular:  S1, S2 present without murmurs appreciated. Normal pulses noted. Extremities without clubbing or edema. Respiratory:  Clear to auscultation bilaterally. No wheezes, rales, or rhonchi. No distress.  Gastrointestinal:  +BS, soft, non-tender and non-distended. No HSM noted. No guarding or rebound. No masses appreciated.  Rectal:  Deferred  Musculoskalatal:  Symmetrical without gross deformities. Normal posture. Skin:  Intact without significant lesions or rashes. Neurologic:  Alert and oriented x4;  grossly normal neurologically. Psych:  Alert and cooperative. Normal mood and affect. Heme/Lymph/Immune: No significant cervical adenopathy. No excessive bruising noted.    02/10/2018 10:24 AM   Disclaimer: This note was dictated with voice recognition software. Similar sounding words can inadvertently be transcribed and may not be corrected upon review.

## 2018-02-10 NOTE — Telephone Encounter (Signed)
Noted  

## 2018-03-08 ENCOUNTER — Other Ambulatory Visit: Payer: Self-pay | Admitting: Gastroenterology

## 2018-03-08 DIAGNOSIS — R748 Abnormal levels of other serum enzymes: Secondary | ICD-10-CM

## 2018-03-08 DIAGNOSIS — R109 Unspecified abdominal pain: Secondary | ICD-10-CM

## 2018-03-08 DIAGNOSIS — R110 Nausea: Secondary | ICD-10-CM

## 2018-03-08 DIAGNOSIS — K219 Gastro-esophageal reflux disease without esophagitis: Secondary | ICD-10-CM

## 2018-04-20 DIAGNOSIS — M6530 Trigger finger, unspecified finger: Secondary | ICD-10-CM | POA: Diagnosis not present

## 2018-05-01 DIAGNOSIS — G894 Chronic pain syndrome: Secondary | ICD-10-CM | POA: Diagnosis not present

## 2018-05-01 DIAGNOSIS — Z79899 Other long term (current) drug therapy: Secondary | ICD-10-CM | POA: Diagnosis not present

## 2018-05-01 DIAGNOSIS — M961 Postlaminectomy syndrome, not elsewhere classified: Secondary | ICD-10-CM | POA: Diagnosis not present

## 2018-05-01 DIAGNOSIS — Z79891 Long term (current) use of opiate analgesic: Secondary | ICD-10-CM | POA: Diagnosis not present

## 2018-05-01 DIAGNOSIS — M79605 Pain in left leg: Secondary | ICD-10-CM | POA: Diagnosis not present

## 2018-05-01 DIAGNOSIS — T85122A Displacement of implanted electronic neurostimulator (electrode) of spinal cord, initial encounter: Secondary | ICD-10-CM | POA: Diagnosis not present

## 2018-05-03 ENCOUNTER — Other Ambulatory Visit: Payer: Self-pay | Admitting: Gastroenterology

## 2018-05-26 ENCOUNTER — Other Ambulatory Visit: Payer: Self-pay | Admitting: Nurse Practitioner

## 2018-05-26 DIAGNOSIS — R110 Nausea: Secondary | ICD-10-CM

## 2018-05-26 DIAGNOSIS — R109 Unspecified abdominal pain: Secondary | ICD-10-CM

## 2018-05-26 DIAGNOSIS — R748 Abnormal levels of other serum enzymes: Secondary | ICD-10-CM

## 2018-05-26 DIAGNOSIS — K219 Gastro-esophageal reflux disease without esophagitis: Secondary | ICD-10-CM

## 2018-05-29 DIAGNOSIS — Z79891 Long term (current) use of opiate analgesic: Secondary | ICD-10-CM | POA: Diagnosis not present

## 2018-05-29 DIAGNOSIS — T85122A Displacement of implanted electronic neurostimulator (electrode) of spinal cord, initial encounter: Secondary | ICD-10-CM | POA: Diagnosis not present

## 2018-05-29 DIAGNOSIS — M961 Postlaminectomy syndrome, not elsewhere classified: Secondary | ICD-10-CM | POA: Diagnosis not present

## 2018-05-29 DIAGNOSIS — G894 Chronic pain syndrome: Secondary | ICD-10-CM | POA: Diagnosis not present

## 2018-05-29 DIAGNOSIS — M545 Low back pain: Secondary | ICD-10-CM | POA: Diagnosis not present

## 2018-05-29 DIAGNOSIS — Z79899 Other long term (current) drug therapy: Secondary | ICD-10-CM | POA: Diagnosis not present

## 2018-08-10 ENCOUNTER — Other Ambulatory Visit: Payer: Self-pay | Admitting: Nurse Practitioner

## 2018-08-10 DIAGNOSIS — R748 Abnormal levels of other serum enzymes: Secondary | ICD-10-CM

## 2018-08-10 DIAGNOSIS — R109 Unspecified abdominal pain: Secondary | ICD-10-CM

## 2018-08-10 DIAGNOSIS — R110 Nausea: Secondary | ICD-10-CM

## 2018-08-10 DIAGNOSIS — K219 Gastro-esophageal reflux disease without esophagitis: Secondary | ICD-10-CM

## 2018-08-11 NOTE — Telephone Encounter (Signed)
Refill X 1.   Patient has no showed her last two OV.  Last seen in 2018.   She needs OV. If she does not complete OV, no further refills to be provided.

## 2018-08-14 ENCOUNTER — Encounter: Payer: Self-pay | Admitting: Gastroenterology

## 2018-08-14 NOTE — Telephone Encounter (Signed)
SCHEDULED AND LETTER SENT  °

## 2018-09-21 ENCOUNTER — Other Ambulatory Visit: Payer: Self-pay | Admitting: Nurse Practitioner

## 2018-10-18 ENCOUNTER — Ambulatory Visit (INDEPENDENT_AMBULATORY_CARE_PROVIDER_SITE_OTHER): Payer: Medicare Other | Admitting: Gastroenterology

## 2018-10-18 ENCOUNTER — Other Ambulatory Visit: Payer: Self-pay

## 2018-10-18 ENCOUNTER — Encounter: Payer: Self-pay | Admitting: Gastroenterology

## 2018-10-18 DIAGNOSIS — R748 Abnormal levels of other serum enzymes: Secondary | ICD-10-CM | POA: Diagnosis not present

## 2018-10-18 DIAGNOSIS — K219 Gastro-esophageal reflux disease without esophagitis: Secondary | ICD-10-CM

## 2018-10-18 DIAGNOSIS — R110 Nausea: Secondary | ICD-10-CM

## 2018-10-18 DIAGNOSIS — R109 Unspecified abdominal pain: Secondary | ICD-10-CM

## 2018-10-18 DIAGNOSIS — R140 Abdominal distension (gaseous): Secondary | ICD-10-CM

## 2018-10-18 MED ORDER — DEXLANSOPRAZOLE 60 MG PO CPDR: 1.0000 | DELAYED_RELEASE_CAPSULE | Freq: Every day | ORAL | 3 refills | Status: DC

## 2018-10-18 NOTE — Patient Instructions (Signed)
1. Please go for your labs at [facility] at your convenience. 2. I was unable to locate a copy of your last colonoscopy.  Based on documentation at previous office visits, it sounds like your colonoscopy is approaching 60 years old at this point. 3. We will contact you with results of your labs as soon as they are available.  If no significant findings, next step may be updating colonoscopy or adding another medication (something like Pepcid or Tagamet) for your stomach. 4. The following diet should be considered especially when you are having increased problems with abdominal bloating and nausea.   Bland Diet A bland diet consists of foods that are often soft and do not have a lot of fat, fiber, or extra seasonings. Foods without fat, fiber, or seasoning are easier for the body to digest. They are also less likely to irritate your mouth, throat, stomach, and other parts of your digestive system. A bland diet is sometimes called a BRAT diet. What is my plan? Your health care provider or food and nutrition specialist (dietitian) may recommend specific changes to your diet to prevent symptoms or to treat your symptoms. These changes may include:  Eating small meals often.  Cooking food until it is soft enough to chew easily.  Chewing your food well.  Drinking fluids slowly.  Not eating foods that are very spicy, sour, or fatty.  Not eating citrus fruits, such as oranges and grapefruit. What do I need to know about this diet?  Eat a variety of foods from the bland diet food list.  Do not follow a bland diet longer than needed.  Ask your health care provider whether you should take vitamins or supplements. What foods can I eat? Grains  Hot cereals, such as cream of wheat. Rice. Bread, crackers, or tortillas made from refined white flour. Vegetables Canned or cooked vegetables. Mashed or boiled potatoes. Fruits  Bananas. Applesauce. Other types of cooked or canned fruit with the skin and  seeds removed, such as canned peaches or pears. Meats and other proteins  Scrambled eggs. Creamy peanut butter or other nut butters. Lean, well-cooked meats, such as chicken or fish. Tofu. Soups or broths. Dairy Low-fat dairy products, such as milk, cottage cheese, or yogurt. Beverages  Water. Herbal tea. Apple juice. Fats and oils Mild salad dressings. Canola or olive oil. Sweets and desserts Pudding. Custard. Fruit gelatin. Ice cream. The items listed above may not be a complete list of recommended foods and beverages. Contact a dietitian for more options. What foods are not recommended? Grains Whole grain breads and cereals. Vegetables Raw vegetables. Fruits Raw fruits, especially citrus, berries, or dried fruits. Dairy Whole fat dairy foods. Beverages Caffeinated drinks. Alcohol. Seasonings and condiments Strongly flavored seasonings or condiments. Hot sauce. Salsa. Other foods Spicy foods. Fried foods. Sour foods, such as pickled or fermented foods. Foods with high sugar content. Foods high in fiber. The items listed above may not be a complete list of foods and beverages to avoid. Contact a dietitian for more information. Summary  A bland diet consists of foods that are often soft and do not have a lot of fat, fiber, or extra seasonings.  Foods without fat, fiber, or seasoning are easier for the body to digest.  Check with your health care provider to see how long you should follow this diet plan. It is not meant to be followed for long periods. This information is not intended to replace advice given to you by your health care  provider. Make sure you discuss any questions you have with your health care provider. Document Released: 09/15/2015 Document Revised: 06/22/2017 Document Reviewed: 06/22/2017 Elsevier Interactive Patient Education  2019 [employer].

## 2018-10-18 NOTE — Progress Notes (Signed)
Primary Care Physician:  Ernestina PennaMoore, Donald W, MD Primary GI:  Roetta SessionsMichael Rourk, MD    Patient Location: Home  [facility]  Reason for Visit: refills, abdominal bloating and nausea  Persons present on the virtual encounter, with roles: Patient, myself (provider), Elinor ParkinsonAngela Stallings, CMA (updated meds and allergies)  Total time (minutes) spent on medical discussion: 20 minutes  Due to COVID-19, visit was conducted using [email or web address] method.  Visit was requested by patient.  Virtual Visit via [email or web address]  I connected with Susan Potts on 10/18/18 at 10:30 AM EDT by [email or web address] and verified that I am speaking with the correct person using two identifiers.   I discussed the limitations, risks, security and privacy concerns of performing an evaluation and management service by telephone/video and the availability of in person appointments. I also discussed with the patient that there may be a patient responsible charge related to this service. The patient expressed understanding and agreed to proceed.   HPI:   Susan Potts is a 58 y.o. female who presents for virtual visit regarding reflux, needing refills.  Patient last seen in October 2018.  History abdominal pain and nausea.  Also with history of chronically elevated serum lipase.  Previously endoscopic ultrasound recommended, she canceled the procedure.  No CT changes of pancreatitis on June 2016 CT.  Repeat CT August 2016 showed persistently normal pancreas, possible subclinical pancreatitis.  Ultimately underwent EUS by Dr. Christella HartiganJacobs in February 2017, noted to be normal exam with doubtful bouts of acute pancreatitis given repeatedly normal appearance of the pancreas on imaging.  Recommended to observe clinically.  Last EGD in September 2016, gastric and duodenal erosions noted.  Gastric biopsy showed reactive gastropathy, no H. pylori.  Patient continues to have similar symptoms as for which we have seen her for for several  years.  Feels bloated and swollen after meals.  Does not matter what she eats.  Describes some early satiety.  Denies any weight loss.  Actually has gained some weight. Sometimes upper abdomen feels a little sore. Some days don't want to eat at all. No vomiting. Takes zofran or phenergan for nausea. Has to take daily. No heartburn as long as she takes Dexilant. BM regular. No melena, brbpr. No weight loss.   Every morning when she wakes up she does not eat until late afternoon or suppertime.  Throughout the day she will consume tea, coffee, seltzer water during morning.  Typically eats whenever her daughter fixes her for dinner.  May be a meat and vegetables.  Pretty vague about her diet intake.  Sometimes can finish plate and sometimes doesn't eat much of it. Takes BCs but slowed down some, couple of times per week. No other NSAIDs.   She states is been a long time since she saw her PCP and had blood work.   Last colonoscopy several years ago.  She states she had at [facility], reports no polyps.  I cannot find any records.  According to our records in 2016 she had told us [place] of [place] years ago with no polyps.  Likely due for colonoscopy at this point.   Current Outpatient Medications  Medication Sig Dispense Refill  . Aspirin-Salicylamide-Caffeine (BC HEADACHE POWDER PO) Take 1 packet by mouth every 6 (six) hours as needed (migraine headache).    . DEXILANT 60 MG capsule TAKE ONE CAPSULE BY MOUTH ONCE DAILY. 90 capsule 0  . ondansetron (ZOFRAN) 4 MG tablet  TAKE ONE TABLET BY MOUTH EVERY 8 HOURS AS NEEDED FOR NAUSEA OR VOMITING. 30 tablet 3  . promethazine (PHENERGAN) 25 MG tablet TAKE 1/2 TABLET BY MOUTH EVERY 6 HOURS AS NEEDED FOR NAUSEA. 30 tablet 3   No current facility-administered medications for this visit.     Past Medical History:  Diagnosis Date  . Anemia    history in the past  . Carpal tunnel syndrome   . Chronic back pain   . Chronic neck pain    . COPD (chronic obstructive pulmonary disease) (HCC)   . Degenerative disc disease    LS spine discectomy and fusion; parasternal rod  . Fibromyalgia   . Gastroesophageal reflux disease   . History of bronchitis   . History of hiatal hernia   . Hyperlipidemia   . Neuropathy    ambulates with cane  . Pancreatitis   . PSVT (paroxysmal supraventricular tachycardia) (HCC)    normal coronary angiography in 2006; records from EMS documented supraventricular tachycardia at approximately 195 bpm on 05/10/10  . Shortness of breath   . Tobacco abuse    40 pack years; one pack per day  . Ulnar neuritis     Past Surgical History:  Procedure Laterality Date  . BACK SURGERY    . BIOPSY  03/06/2015   RMR: Gastric and duodenal erosions status post gastric biopsy. Chroniclly elevated serum lipase  . CARPAL TUNNEL RELEASE  2009   Right  . CESAREAN SECTION  1995  . ESOPHAGOGASTRODUODENOSCOPY (EGD) WITH PROPOFOL N/A 03/06/2015   Dr. Lovena Neighbours: Gastric and duodenal erosions, gastric biopsy showed reactive gastropathy, no H. pylori.  . EUS N/A 07/24/2015   Dr. Christella Hartigan: Normal exam, normal pancreas with doubtful bouts of acute pancreatitis given repeatedly normal appearance of the pancreas on imaging.  Recommend observe clinically.  Marland Kitchen GANGLION CYST EXCISION  2009   Right wrist  . LUMBAR DISC SURGERY  07/2010   fusion  . LUMBAR LAMINECTOMY/DECOMPRESSION MICRODISCECTOMY Left 09/14/2012   Procedure: LUMBAR LAMINECTOMY/DECOMPRESSION MICRODISCECTOMY 1 LEVEL;  Surgeon: Cristi Loron, MD;  Location: [facility];  Service: Neurosurgery;  Laterality: Left;  LEFT Lumbar three-four diskectomy  . SPINAL CORD STIMULATOR INSERTION N/A 11/06/2015   Procedure: LUMBAR SPINAL CORD STIMULATOR INSERTION;  Surgeon: Tressie Stalker, MD;  Location: [facility];  Service: Neurosurgery;  Laterality: N/A;  LUMBAR SPINAL CORD STIMULATOR INSERTION  . SPINAL FIXATION SURGERY W/ IMPLANT     L4, L5  . TUBAL LIGATION  1995     Family History  Problem Relation Age of Onset  . Arthritis Other   . Stroke Other   . Cancer Mother   . Diabetes Mother   . Hypertension Mother   . Thyroid disease Mother   . Diabetes Brother   . Hypertension Brother   . Colon cancer Neg Hx     Social History   Socioeconomic History  . Marital status: Single    Spouse name: Not on file  . Number of children: Not on file  . Years of education: Not on file  . Highest education level: Not on file  Occupational History  . Occupation: Unemployed    Comment: Disabled  Social Needs  . Financial resource strain: Not on file  . Food insecurity:    Worry: Not on file    Inability: Not on file  . Transportation needs:    Medical: Not on file    Non-medical: Not on file  Tobacco Use  . Smoking status: Current Every  Day Smoker    Packs/day: 1.00    Years: 43.00    Pack years: 43.00    Types: E-cigarettes    Last attempt to quit: 12/31/2014    Years since quitting: 3.8  . Smokeless tobacco: Never Used  . Tobacco comment: stopped smoking around June 2016, now uses VAPES  Substance and Sexual Activity  . Alcohol use: No    Alcohol/week: 0.0 standard drinks  . Drug use: No  . Sexual activity: Yes    Birth control/protection: Surgical  Lifestyle  . Physical activity:    Days per week: Not on file    Minutes per session: Not on file  . Stress: Not on file  Relationships  . Social connections:    Talks on phone: Not on file    Gets together: Not on file    Attends religious service: Not on file    Active member of club or organization: Not on file    Attends meetings of clubs or organizations: Not on file    Relationship status: Not on file  . Intimate partner violence:    Fear of current or ex partner: Not on file    Emotionally abused: Not on file    Physically abused: Not on file    Forced sexual activity: Not on file  Other Topics Concern  . Not on file  Social History Narrative   Divorced.      ROS:   General: Negative for anorexia, weight loss, fever, chills, fatigue, weakness. Eyes: Negative for vision changes.  ENT: Negative for hoarseness, difficulty swallowing , nasal congestion. CV: Negative for chest pain, angina, palpitations, dyspnea on exertion, peripheral edema.  Respiratory: Negative for dyspnea at rest, dyspnea on exertion, cough, sputum, wheezing.  GI: See history of present illness. GU:  Negative for dysuria, hematuria, urinary incontinence, urinary frequency, nocturnal urination.  MS: Negative for joint pain, low back pain.  Derm: Negative for rash or itching.  Neuro: Negative for weakness, abnormal sensation, seizure, frequent headaches, memory loss, confusion.  Intermittent headache Psych: Negative for anxiety, depression, suicidal ideation, hallucinations.  Endo: Negative for unusual weight change.  Heme: Negative for bruising or bleeding. Allergy: Negative for rash or hives.   Observations/Objective: Pleasant 58 year old female resting comfortably.  She is laying down during this visit.  She is vaping.  Otherwise no acute distress and no other exam available.  Assessment and Plan: 58 year old female with chronic GERD, chronic postprandial bloating and nausea.  She continues to use [employer] powders which she has used for years although she feels like she is to cut back significantly.  In the past she has had mildly elevated lipase, imaging has failed to show any evidence of pancreatitis.  EUS was unremarkable.  It is not clear that she truly had pancreatitis based on findings.  Last EGD in 2016 as outlined.  Not clear when her last colonoscopy was.  Sounds like her symptoms have not significantly changed throughout the years.  We will update her labs.  Continue Dexilant 60 mg daily.  I will look further to see if we can locate a colonoscopy report, likely is overdue at this time.  Further recommendations to follow.  Follow Up Instructions:    I discussed the assessment and  treatment plan with the patient. The patient was provided an opportunity to ask questions and all were answered. The patient agreed with the plan and demonstrated an understanding of the instructions. AVS mailed to patient's home address.   The patient was  advised to call back or seek an in-person evaluation if the symptoms worsen or if the condition fails to improve as anticipated.  I provided 20 minutes of virtual face-to-face time during this encounter.   Neil Crouch, PA-C

## 2018-10-19 ENCOUNTER — Encounter: Payer: Self-pay | Admitting: Gastroenterology

## 2018-10-19 NOTE — Progress Notes (Signed)
cc'd to pcp 

## 2018-10-23 ENCOUNTER — Other Ambulatory Visit: Payer: Self-pay | Admitting: Gastroenterology

## 2018-10-23 DIAGNOSIS — R110 Nausea: Secondary | ICD-10-CM

## 2018-10-23 DIAGNOSIS — R748 Abnormal levels of other serum enzymes: Secondary | ICD-10-CM

## 2018-10-23 DIAGNOSIS — K219 Gastro-esophageal reflux disease without esophagitis: Secondary | ICD-10-CM

## 2018-10-23 DIAGNOSIS — R109 Unspecified abdominal pain: Secondary | ICD-10-CM

## 2018-10-27 LAB — CBC WITH DIFFERENTIAL/PLATELET
Basophils Absolute: 0 10*3/uL (ref 0.0–0.2)
Basos: 1 %
EOS (ABSOLUTE): 0.1 10*3/uL (ref 0.0–0.4)
Eos: 3 %
Hematocrit: 39.6 % (ref 34.0–46.6)
Hemoglobin: 13.6 g/dL (ref 11.1–15.9)
Immature Grans (Abs): 0 10*3/uL (ref 0.0–0.1)
Immature Granulocytes: 0 %
Lymphocytes Absolute: 1.5 10*3/uL (ref 0.7–3.1)
Lymphs: 34 %
MCH: 31.6 pg (ref 26.6–33.0)
MCHC: 34.3 g/dL (ref 31.5–35.7)
MCV: 92 fL (ref 79–97)
Monocytes Absolute: 0.4 10*3/uL (ref 0.1–0.9)
Monocytes: 8 %
Neutrophils Absolute: 2.5 10*3/uL (ref 1.4–7.0)
Neutrophils: 54 %
Platelets: 222 10*3/uL (ref 150–450)
RBC: 4.31 x10E6/uL (ref 3.77–5.28)
RDW: 12.3 % (ref 11.7–15.4)
WBC: 4.5 10*3/uL (ref 3.4–10.8)

## 2018-10-27 LAB — COMPREHENSIVE METABOLIC PANEL
ALT: 10 IU/L (ref 0–32)
AST: 13 IU/L (ref 0–40)
Albumin/Globulin Ratio: 1.7 (ref 1.2–2.2)
Albumin: 4.3 g/dL (ref 3.8–4.9)
Alkaline Phosphatase: 99 IU/L (ref 39–117)
BUN/Creatinine Ratio: 23 (ref 12–28)
BUN: 16 mg/dL (ref 8–27)
Bilirubin Total: <0.2 mg/dL (ref 0.0–1.2)
CO2: 23 mmol/L (ref 20–29)
Calcium: 9.1 mg/dL (ref 8.7–10.3)
Chloride: 106 mmol/L (ref 96–106)
Creatinine, Ser: 0.71 mg/dL (ref 0.57–1.00)
GFR calc Af Amer: 107 mL/min/{1.73_m2} (ref >59)
GFR calc non Af Amer: 93 mL/min/{1.73_m2} (ref >59)
Globulin, Total: 2.5 g/dL (ref 1.5–4.5)
Glucose: 108 mg/dL — ABNORMAL HIGH (ref 65–99)
Potassium: 4.4 mmol/L (ref 3.5–5.2)
Sodium: 140 mmol/L (ref 134–144)
Total Protein: 6.8 g/dL (ref 6.0–8.5)

## 2018-10-27 LAB — IGA: IgA/Immunoglobulin A, Serum: 227 mg/dL (ref 87–352)

## 2018-10-27 LAB — LIPASE: Lipase: 172 U/L — ABNORMAL HIGH (ref 14–72)

## 2018-10-27 LAB — TISSUE TRANSGLUTAMINASE, IGA: Transglutaminase IgA: <2 U/mL (ref 0–3)

## 2018-10-27 LAB — TSH+FREE T4
Free T4: 1.08 ng/dL (ref 0.82–1.77)
TSH: 1.75 u[IU]/mL (ref 0.450–4.500)

## 2018-11-13 ENCOUNTER — Other Ambulatory Visit: Payer: Self-pay | Admitting: *Deleted

## 2018-11-13 ENCOUNTER — Encounter: Payer: Self-pay | Admitting: *Deleted

## 2018-11-13 DIAGNOSIS — R109 Unspecified abdominal pain: Secondary | ICD-10-CM

## 2018-11-13 DIAGNOSIS — R110 Nausea: Secondary | ICD-10-CM

## 2018-11-13 DIAGNOSIS — Z1211 Encounter for screening for malignant neoplasm of colon: Secondary | ICD-10-CM

## 2018-11-13 MED ORDER — PEG 3350-KCL-NA BICARB-NACL 420 G PO SOLR: 4000.0000 mL | Freq: Once | ORAL | 0 refills | Status: AC

## 2018-11-13 NOTE — Progress Notes (Signed)
LMOM to call.

## 2018-11-13 NOTE — Progress Notes (Signed)
PT is aware and OK to schedule the EGD and colonoscopy.  Forwarding to [facility].

## 2019-01-08 ENCOUNTER — Other Ambulatory Visit: Payer: Self-pay | Admitting: Nurse Practitioner

## 2019-01-08 DIAGNOSIS — R748 Abnormal levels of other serum enzymes: Secondary | ICD-10-CM

## 2019-01-08 DIAGNOSIS — K219 Gastro-esophageal reflux disease without esophagitis: Secondary | ICD-10-CM

## 2019-01-08 DIAGNOSIS — R110 Nausea: Secondary | ICD-10-CM

## 2019-01-08 DIAGNOSIS — R109 Unspecified abdominal pain: Secondary | ICD-10-CM

## 2019-01-17 ENCOUNTER — Telehealth: Payer: Self-pay | Admitting: Gastroenterology

## 2019-01-17 NOTE — Telephone Encounter (Signed)
PATIENT CALLED TO CANCEL HER PROCUDURE HAS COVID

## 2019-01-17 NOTE — Telephone Encounter (Signed)
lmovm

## 2019-01-18 ENCOUNTER — Other Ambulatory Visit: Payer: Self-pay

## 2019-01-18 ENCOUNTER — Other Ambulatory Visit (HOSPITAL_COMMUNITY)
Admission: RE | Admit: 2019-01-18 | Discharge: 2019-01-18 | Disposition: A | Discharge status: Home / Self Care | Payer: Medicare Other | Source: Ambulatory Visit | Attending: Internal Medicine | Admitting: Internal Medicine

## 2019-01-18 ENCOUNTER — Encounter (HOSPITAL_COMMUNITY)
Admission: RE | Admit: 2019-01-18 | Discharge: 2019-01-18 | Disposition: A | Discharge status: Home / Self Care | Payer: Medicare Other | Source: Ambulatory Visit | Attending: Internal Medicine | Admitting: Internal Medicine

## 2019-01-18 NOTE — Telephone Encounter (Signed)
LMOVM

## 2019-01-18 NOTE — Telephone Encounter (Signed)
noted 

## 2019-01-18 NOTE — Telephone Encounter (Signed)
Spoke with patient. She wants to cancel for procedure for now. States everyone in her house including her is sick. She will call back to r/s. Called endo and LMOVM to cancel procedure

## 2019-01-22 ENCOUNTER — Ambulatory Visit (HOSPITAL_COMMUNITY): Admission: RE | Admit: 2019-01-22 | Payer: Medicare Other | Source: Home / Self Care | Admitting: Internal Medicine

## 2019-01-22 ENCOUNTER — Encounter (HOSPITAL_COMMUNITY): Admission: RE | Payer: Self-pay | Source: Home / Self Care

## 2019-01-22 SURGERY — COLONOSCOPY WITH PROPOFOL
Anesthesia: Monitor Anesthesia Care

## 2019-01-31 ENCOUNTER — Ambulatory Visit: Payer: Medicare Other | Admitting: Gastroenterology

## 2019-01-31 ENCOUNTER — Telehealth: Payer: Self-pay | Admitting: Gastroenterology

## 2019-01-31 NOTE — Telephone Encounter (Signed)
PATIENT WAS A NO SHOW, SEVERAL NO SHOWS PRIOR.  SHOULD I SEND ANOTHER LETTER?

## 2019-01-31 NOTE — Telephone Encounter (Signed)
Patient called and cancelled her procedure that was schedule for this month.  She stated everyone was sick in her house. It's ok to send another no-show letter.

## 2019-02-01 ENCOUNTER — Encounter: Payer: Self-pay | Admitting: Gastroenterology

## 2019-02-01 NOTE — Telephone Encounter (Signed)
Sent no show policy letter to patient

## 2019-07-02 ENCOUNTER — Other Ambulatory Visit: Payer: Self-pay | Admitting: Nurse Practitioner

## 2019-07-27 ENCOUNTER — Other Ambulatory Visit: Payer: Self-pay | Admitting: Nurse Practitioner

## 2019-07-27 DIAGNOSIS — R109 Unspecified abdominal pain: Secondary | ICD-10-CM

## 2019-07-27 DIAGNOSIS — K219 Gastro-esophageal reflux disease without esophagitis: Secondary | ICD-10-CM

## 2019-07-27 DIAGNOSIS — R110 Nausea: Secondary | ICD-10-CM

## 2019-07-27 DIAGNOSIS — R748 Abnormal levels of other serum enzymes: Secondary | ICD-10-CM

## 2019-10-22 ENCOUNTER — Other Ambulatory Visit: Payer: Self-pay | Admitting: Nurse Practitioner

## 2019-12-11 ENCOUNTER — Other Ambulatory Visit: Payer: Self-pay | Admitting: Gastroenterology

## 2019-12-11 ENCOUNTER — Other Ambulatory Visit: Payer: Self-pay | Admitting: Nurse Practitioner

## 2019-12-11 DIAGNOSIS — R109 Unspecified abdominal pain: Secondary | ICD-10-CM

## 2019-12-11 DIAGNOSIS — R748 Abnormal levels of other serum enzymes: Secondary | ICD-10-CM

## 2019-12-11 DIAGNOSIS — R110 Nausea: Secondary | ICD-10-CM

## 2019-12-11 DIAGNOSIS — K219 Gastro-esophageal reflux disease without esophagitis: Secondary | ICD-10-CM

## 2019-12-11 NOTE — Telephone Encounter (Signed)
Patient needs follow up ov. Provided limited refills.

## 2019-12-12 ENCOUNTER — Encounter: Payer: Self-pay | Admitting: Internal Medicine

## 2019-12-12 NOTE — Telephone Encounter (Signed)
Sent patient letter to call and make appointment  

## 2023-11-16 ENCOUNTER — Ambulatory Visit (INDEPENDENT_AMBULATORY_CARE_PROVIDER_SITE_OTHER): Admitting: Orthopaedic Surgery

## 2023-11-16 ENCOUNTER — Encounter: Payer: Self-pay | Admitting: Orthopaedic Surgery

## 2023-11-16 VITALS — BP 143/96 | HR 68 | Ht 67.0 in | Wt 194.0 lb

## 2023-11-16 DIAGNOSIS — M79672 Pain in left foot: Secondary | ICD-10-CM

## 2023-11-16 MED ORDER — HYDROCODONE-ACETAMINOPHEN 5-325 MG PO TABS: 1.0000 | ORAL_TABLET | ORAL | 0 refills | Status: AC | PRN

## 2023-11-16 NOTE — Progress Notes (Signed)
 Subjective:    Patient ID: Susan Potts, female    DOB: 1961-01-08, 63 y.o.   MRN: [id number]  HPI She fell and hurt her left foot on 11-07-23.  She was seen at [facility].  X-rays were done there.  I have the CD but no notes.  She was told she had a fracture of the fourth toe displaced 3 cm.  She was given a CAM walker.  She has elevated the foot.  She uses a cane also.  She has bruising but no redness.  The foot hurts some but it is less.   Review of Systems  Constitutional:  Positive for activity change.  Respiratory:  Positive for shortness of breath.   Musculoskeletal:  Positive for arthralgias, back pain, gait problem, joint swelling, myalgias and neck pain.  All other systems reviewed and are negative. For Review of Systems, all other systems reviewed and are negative.  The following is a summary of the past history medically, past history surgically, known current medicines, social history and family history.  This information is gathered electronically by the computer from prior information and documentation.  I review this each visit and have found including this information at this point in the chart is beneficial and informative.   Past Medical History:  Diagnosis Date   Anemia    history in the past   Carpal tunnel syndrome    Chronic back pain    Chronic neck pain    COPD (chronic obstructive pulmonary disease) (HCC)    Degenerative disc disease    LS spine discectomy and fusion; parasternal rod   Fibromyalgia    Gastroesophageal reflux disease    History of bronchitis    History of hiatal hernia    Hyperlipidemia    Neuropathy    ambulates with cane   Pancreatitis    PSVT (paroxysmal supraventricular tachycardia) (HCC)    normal coronary angiography in 2006; records from EMS documented supraventricular tachycardia at approximately 195 bpm on 05/10/10   Shortness of breath    Tobacco abuse    40 pack years; one pack per day   Ulnar neuritis      Past Surgical History:  Procedure Laterality Date   BACK SURGERY     BIOPSY  03/06/2015   RMR: Gastric and duodenal erosions status post gastric biopsy. Chroniclly elevated serum lipase   CARPAL TUNNEL RELEASE  2009   Right   CESAREAN SECTION  1995   ESOPHAGOGASTRODUODENOSCOPY (EGD) WITH PROPOFOL  N/A 03/06/2015   Dr. Narvis Bad: Gastric and duodenal erosions, gastric biopsy showed reactive gastropathy, no H. pylori.   EUS N/A 07/24/2015   Dr. Howard Macho: Normal exam, normal pancreas with doubtful bouts of acute pancreatitis given repeatedly normal appearance of the pancreas on imaging.  Recommend observe clinically.   GANGLION CYST EXCISION  2009   Right wrist   LUMBAR DISC SURGERY  07/2010   fusion   LUMBAR LAMINECTOMY/DECOMPRESSION MICRODISCECTOMY Left 09/14/2012   Procedure: LUMBAR LAMINECTOMY/DECOMPRESSION MICRODISCECTOMY 1 LEVEL;  Surgeon: Elder Greening, MD;  Location: [facility];  Service: Neurosurgery;  Laterality: Left;  LEFT Lumbar three-four diskectomy   SPINAL CORD STIMULATOR INSERTION N/A 11/06/2015   Procedure: LUMBAR SPINAL CORD STIMULATOR INSERTION;  Surgeon: Garry Kansas, MD;  Location: [facility];  Service: Neurosurgery;  Laterality: N/A;  LUMBAR SPINAL CORD STIMULATOR INSERTION   SPINAL FIXATION SURGERY W/ IMPLANT     L4, L5   TUBAL LIGATION  1995    Current Outpatient Medications  on File Prior to Visit  Medication Sig Dispense Refill   Aspirin-Salicylamide-Caffeine (BC HEADACHE POWDER PO) Take 1 packet by mouth every 6 (six) hours as needed (migraine headache).     DEXILANT  60 MG capsule TAKE ONE CAPSULE BY MOUTH ONCE DAILY. (Patient not taking: Reported on 11/16/2023) 90 capsule 0   ondansetron  (ZOFRAN ) 4 MG tablet TAKE ONE TABLET BY MOUTH EVERY 8 HOURS AS NEEDED FOR NAUSEA OR VOMITING. (Patient not taking: Reported on 11/16/2023) 30 tablet 5   promethazine  (PHENERGAN ) 25 MG tablet TAKE 1/2 TABLET BY MOUTH EVERY 6 HOURS AS NEEDED FOR NAUSEA. (Patient not taking:  Reported on 11/16/2023) 30 tablet 0   No current facility-administered medications on file prior to visit.    Social History   Socioeconomic History   Marital status: Single    Spouse name: Not on file   Number of children: Not on file   Years of education: Not on file   Highest education level: Not on file  Occupational History   Occupation: Unemployed    Comment: Disabled  Tobacco Use   Smoking status: Every Day    Current packs/day: 0.00    Average packs/day: 1 pack/day for 43.0 years (43.0 ttl pk-yrs)    Types: E-cigarettes, Cigarettes    Start date: 12/31/1971    Last attempt to quit: 12/31/2014    Years since quitting: 8.8   Smokeless tobacco: Never   Tobacco comments:    stopped smoking around June 2016, now uses VAPES  Vaping Use   Vaping status: Every Day   Start date: 11/06/2014  Substance and Sexual Activity   Alcohol  use: No    Alcohol /week: 0.0 standard drinks of alcohol    Drug use: No   Sexual activity: Yes    Birth control/protection: Surgical  Other Topics Concern   Not on file  Social History Narrative   Divorced.   Social Drivers of Corporate investment banker Strain: Not on file  Food Insecurity: Not on file  Transportation Needs: Not on file  Physical Activity: Not on file  Stress: Not on file  Social Connections: Not on file  Intimate Partner Violence: Not on file    Family History  Problem Relation Age of Onset   Arthritis Other    Stroke Other    Cancer Mother    Diabetes Mother    Hypertension Mother    Thyroid disease Mother    Diabetes Brother    Hypertension Brother    Colon cancer Neg Hx     BP (!) 143/96   Pulse 68   Ht 5' 7 (1.702 m)   Wt 194 lb (88 kg)   BMI 30.38 kg/m   Body mass index is 30.38 kg/m.      Objective:   Physical Exam Vitals and nursing note reviewed. Exam conducted with a chaperone present.  Constitutional:      Appearance: She is well-developed.  HENT:     Head: Normocephalic and atraumatic.   Eyes:     Conjunctiva/sclera: Conjunctivae normal.     Pupils: Pupils are equal, round, and reactive to light.  Cardiovascular:     Rate and Rhythm: Normal rate and regular rhythm.  Pulmonary:     Effort: Pulmonary effort is normal.  Abdominal:     Palpations: Abdomen is soft.  Musculoskeletal:     Cervical back: Normal range of motion and neck supple.       Feet:  Skin:    General: Skin is warm and  dry.  Neurological:     Mental Status: She is alert and oriented to person, place, and time.     Cranial Nerves: No cranial nerve deficit.     Motor: No abnormal muscle tone.     Coordination: Coordination normal.     Deep Tendon Reflexes: Reflexes are normal and symmetric. Reflexes normal.  Psychiatric:        Behavior: Behavior normal.        Thought Content: Thought content normal.        Judgment: Judgment normal.   I have reviewed the X-rays from the CD.  I have independently reviewed and interpreted x-rays of this patient done at another site by another physician or qualified health professional.         Assessment & Plan:   Encounter Diagnosis  Name Primary?   Left foot pain Yes   Continue the CAM walker.  Begin contrast baths, sheet of instructions given.  Return in one week.  X-rays left foot then.  Call if any problem.  Precautions discussed.  Electronically Signed Pleasant Brilliant, MD 6/11/20259:21 AM

## 2023-11-16 NOTE — Patient Instructions (Signed)
 Follow up 1 week with Dr. Iline Mallory

## 2023-11-23 ENCOUNTER — Encounter: Payer: Self-pay | Admitting: Orthopaedic Surgery

## 2023-11-23 ENCOUNTER — Other Ambulatory Visit (INDEPENDENT_AMBULATORY_CARE_PROVIDER_SITE_OTHER)

## 2023-11-23 ENCOUNTER — Ambulatory Visit (INDEPENDENT_AMBULATORY_CARE_PROVIDER_SITE_OTHER): Admitting: Orthopaedic Surgery

## 2023-11-23 VITALS — Ht 67.0 in | Wt 194.0 lb

## 2023-11-23 DIAGNOSIS — M79672 Pain in left foot: Secondary | ICD-10-CM

## 2023-11-23 NOTE — Patient Instructions (Signed)
 CT of your left foot has been sent to [facility], please call them if you have not heard from them for an appoitment at [phone]  Follow up with Dr. Iline Mallory in 2 weeks.

## 2023-11-23 NOTE — Progress Notes (Signed)
 My foot hurts bad still.  She has been using her CAM walker. She has no bruising but has pain over the third and fourth metatarsal heads.    NV intact, no swelling, no ecchymosis of the left foot.  Very tender over the third and fourth metatarsal heads.  ROM of ankle and toes good.  X-rays were done of the left foot, reported separately.  Encounter Diagnosis  Name Primary?   Left foot pain Yes   I will get CT scan of the foot to see if there is an occult fracture.  Continue the CAM walker.  Return in two weeks.  Call if any problem.  Precautions discussed.  Electronically Signed Pleasant Brilliant, MD 6/18/20258:35 AM

## 2023-12-01 ENCOUNTER — Telehealth: Payer: Self-pay | Admitting: Orthopaedic Surgery

## 2023-12-01 NOTE — Telephone Encounter (Signed)
 DR. BRENNA   Patient wants refill on her pain medicine    HYDROcodone -acetaminophen  (NORCO/VICODIN) 5-325 MG tablet   Pharmacy:  [facility]

## 2023-12-02 ENCOUNTER — Ambulatory Visit (HOSPITAL_COMMUNITY)
Admission: RE | Admit: 2023-12-02 | Discharge: 2023-12-02 | Disposition: A | Discharge status: Home / Self Care | Source: Ambulatory Visit | Attending: Orthopaedic Surgery | Admitting: Orthopaedic Surgery

## 2023-12-02 DIAGNOSIS — M79672 Pain in left foot: Secondary | ICD-10-CM | POA: Diagnosis present

## 2023-12-02 MED ORDER — HYDROCODONE-ACETAMINOPHEN 5-325 MG PO TABS: ORAL_TABLET | ORAL | 0 refills | Status: DC

## 2023-12-14 ENCOUNTER — Ambulatory Visit (INDEPENDENT_AMBULATORY_CARE_PROVIDER_SITE_OTHER): Admitting: Orthopaedic Surgery

## 2023-12-14 ENCOUNTER — Encounter: Payer: Self-pay | Admitting: Orthopaedic Surgery

## 2023-12-14 DIAGNOSIS — S92315A Nondisplaced fracture of first metatarsal bone, left foot, initial encounter for closed fracture: Secondary | ICD-10-CM

## 2023-12-14 DIAGNOSIS — S92345A Nondisplaced fracture of fourth metatarsal bone, left foot, initial encounter for closed fracture: Secondary | ICD-10-CM

## 2023-12-14 DIAGNOSIS — S92335A Nondisplaced fracture of third metatarsal bone, left foot, initial encounter for closed fracture: Secondary | ICD-10-CM

## 2023-12-14 NOTE — Progress Notes (Signed)
 My foot is still sore.  She had CT of the left foot showing: IMPRESSION: 1. Incompletely healed/healing fractures involving the bases of the third and fourth metatarsals. 2. Intra-articular fracture involving the base of the first metatarsal with a small impacted fracture fragment and no significant healing changes. 3. Tibiotalar and subtalar joint degenerative changes but no ankle fractures. 4. Significant degenerative changes involving the medial aspect of the posterior talocalcaneal facet without obvious coalition.  I have explained the findings to her.  I will have her continue the CAM walker.  The left foot is tender but no redness.  NV intact.  Encounter Diagnoses  Name Primary?   Nondisplaced fracture of third metatarsal bone, left foot, initial encounter for closed fracture Yes   Closed nondisplaced fracture of fourth metatarsal bone of left foot, initial encounter    Nondisplaced fracture of first metatarsal bone, left foot, initial encounter for closed fracture    Return in three weeks.  X-rays then of the left foot.  Call if any problem.  Precautions discussed.  Electronically Signed Lemond Stable, MD 7/9/20258:48 AM

## 2023-12-19 ENCOUNTER — Other Ambulatory Visit: Payer: Self-pay

## 2023-12-19 MED ORDER — HYDROCODONE-ACETAMINOPHEN 5-325 MG PO TABS: ORAL_TABLET | ORAL | 0 refills | Status: DC

## 2023-12-19 NOTE — Telephone Encounter (Signed)
 Hydrocodone -Acetaminophen  5/325 MG  Qty 28 Tablets   PATIENT USES [facility]

## 2024-01-04 ENCOUNTER — Ambulatory Visit (INDEPENDENT_AMBULATORY_CARE_PROVIDER_SITE_OTHER): Admitting: Orthopaedic Surgery

## 2024-01-04 ENCOUNTER — Other Ambulatory Visit (INDEPENDENT_AMBULATORY_CARE_PROVIDER_SITE_OTHER)

## 2024-01-04 ENCOUNTER — Encounter: Payer: Self-pay | Admitting: Orthopaedic Surgery

## 2024-01-04 VITALS — BP 128/79 | HR 48

## 2024-01-04 DIAGNOSIS — S92345D Nondisplaced fracture of fourth metatarsal bone, left foot, subsequent encounter for fracture with routine healing: Secondary | ICD-10-CM | POA: Diagnosis not present

## 2024-01-04 DIAGNOSIS — S92335D Nondisplaced fracture of third metatarsal bone, left foot, subsequent encounter for fracture with routine healing: Secondary | ICD-10-CM

## 2024-01-04 DIAGNOSIS — S92315D Nondisplaced fracture of first metatarsal bone, left foot, subsequent encounter for fracture with routine healing: Secondary | ICD-10-CM

## 2024-01-04 DIAGNOSIS — M79672 Pain in left foot: Secondary | ICD-10-CM

## 2024-01-04 MED ORDER — HYDROCODONE-ACETAMINOPHEN 5-325 MG PO TABS: ORAL_TABLET | ORAL | 0 refills | Status: DC

## 2024-01-04 NOTE — Progress Notes (Signed)
 My foot is sore  She has been using the cam walker and a cane for the left foot fractures.    NV intact.  No new trauma.  X-rays were done today of the left foot, reported separately.  Encounter Diagnoses  Name Primary?   Pain in left foot Yes   Nondisplaced fracture of third metatarsal bone, left foot, subsequent encounter for fracture with routine healing    Closed nondisplaced fracture of fourth metatarsal bone of left foot with routine healing, subsequent encounter    Nondisplaced fracture of first metatarsal bone, left foot, subsequent encounter for fracture with routine healing    Return in two weeks.  X-rays left foot on return.  I have reviewed the [place]  Controlled Substance Reporting System web site prior to prescribing narcotic medicine for this patient.  Call if any problem.  Precautions discussed.  Electronically Signed Lemond Stable, MD 7/30/20259:40 AM

## 2024-01-18 ENCOUNTER — Ambulatory Visit (INDEPENDENT_AMBULATORY_CARE_PROVIDER_SITE_OTHER): Admitting: Orthopaedic Surgery

## 2024-01-18 ENCOUNTER — Other Ambulatory Visit (INDEPENDENT_AMBULATORY_CARE_PROVIDER_SITE_OTHER)

## 2024-01-18 ENCOUNTER — Encounter: Payer: Self-pay | Admitting: Orthopaedic Surgery

## 2024-01-18 DIAGNOSIS — S92345D Nondisplaced fracture of fourth metatarsal bone, left foot, subsequent encounter for fracture with routine healing: Secondary | ICD-10-CM

## 2024-01-18 DIAGNOSIS — S92335D Nondisplaced fracture of third metatarsal bone, left foot, subsequent encounter for fracture with routine healing: Secondary | ICD-10-CM | POA: Diagnosis not present

## 2024-01-18 DIAGNOSIS — S92315D Nondisplaced fracture of first metatarsal bone, left foot, subsequent encounter for fracture with routine healing: Secondary | ICD-10-CM

## 2024-01-18 DIAGNOSIS — M79672 Pain in left foot: Secondary | ICD-10-CM

## 2024-01-18 NOTE — Progress Notes (Signed)
 My foot is much better.  She has less pain of the left foot.  She has some tenderness at times.  She uses a cane.  NV intact.  Gait is good.  Encounter Diagnoses  Name Primary?   Pain in left foot Yes   Closed nondisplaced fracture of fourth metatarsal bone of left foot with routine healing, subsequent encounter    Nondisplaced fracture of first metatarsal bone, left foot, subsequent encounter for fracture with routine healing    Nondisplaced fracture of third metatarsal bone, left foot, subsequent encounter for fracture with routine healing    X-rays were done of the left foot, reported separately.  Return in one month.  If doing well, cancel.  Call if any problem.  Precautions discussed.  Electronically Signed Lemond Stable, MD 8/13/202510:45 AM

## 2024-01-24 ENCOUNTER — Telehealth: Payer: Self-pay | Admitting: Orthopaedic Surgery

## 2024-01-24 NOTE — Telephone Encounter (Signed)
 Dr. Janae pt - pt lvm requesting a refill for Hydrocodone  5-325mg  to be sent to [facility]

## 2024-01-25 MED ORDER — HYDROCODONE-ACETAMINOPHEN 5-325 MG PO TABS: ORAL_TABLET | ORAL | 0 refills | Status: DC

## 2024-01-27 ENCOUNTER — Encounter: Payer: Self-pay | Admitting: Radiology

## 2024-02-01 ENCOUNTER — Encounter: Admitting: Orthopedic Surgery

## 2024-02-08 ENCOUNTER — Telehealth: Payer: Self-pay | Admitting: Orthopaedic Surgery

## 2024-02-08 NOTE — Telephone Encounter (Signed)
 Dr. Janae Susan Potts - Susan Potts lvm asking for something for pain for her foot.  She would like it sent to [facility]

## 2024-02-09 MED ORDER — HYDROCODONE-ACETAMINOPHEN 5-325 MG PO TABS: ORAL_TABLET | ORAL | 0 refills | Status: AC

## 2024-02-09 NOTE — Telephone Encounter (Signed)
 Pt called back and is wanting hydrocodone 

## 2024-02-09 NOTE — Telephone Encounter (Signed)
 I called and left vm for pt to return my call, needing to know if she is inquiring about hydrocodone  or if wants something different. Pending call back from pt.

## 2024-02-15 ENCOUNTER — Encounter: Payer: Self-pay | Admitting: Orthopaedic Surgery

## 2024-02-15 ENCOUNTER — Other Ambulatory Visit (INDEPENDENT_AMBULATORY_CARE_PROVIDER_SITE_OTHER)

## 2024-02-15 ENCOUNTER — Ambulatory Visit (INDEPENDENT_AMBULATORY_CARE_PROVIDER_SITE_OTHER): Admitting: Orthopaedic Surgery

## 2024-02-15 DIAGNOSIS — S92335D Nondisplaced fracture of third metatarsal bone, left foot, subsequent encounter for fracture with routine healing: Secondary | ICD-10-CM

## 2024-02-15 DIAGNOSIS — S92315D Nondisplaced fracture of first metatarsal bone, left foot, subsequent encounter for fracture with routine healing: Secondary | ICD-10-CM

## 2024-02-15 DIAGNOSIS — S92345D Nondisplaced fracture of fourth metatarsal bone, left foot, subsequent encounter for fracture with routine healing: Secondary | ICD-10-CM | POA: Diagnosis not present

## 2024-02-15 DIAGNOSIS — M79672 Pain in left foot: Secondary | ICD-10-CM

## 2024-02-15 NOTE — Progress Notes (Signed)
 My foot feels tight at times.  She has some feeling of tightness of the dorsum of the left foot, more at night.  She is wearing regular shoes.  She uses a cane.  NV intact.  She has no redness.  ROM of ankle is full.  Encounter Diagnoses  Name Primary?   Pain in left foot Yes   Closed nondisplaced fracture of fourth metatarsal bone of left foot with routine healing, subsequent encounter    Nondisplaced fracture of first metatarsal bone, left foot, subsequent encounter for fracture with routine healing    Nondisplaced fracture of third metatarsal bone, left foot, subsequent encounter for fracture with routine healing    I will discharge her today.  I have informed the patient I will be retiring from medical practice and from this office on March 08, 2024.  The patient has been offered continuing care with Dr. Margrette or Dr. Onesimo of this office.  The patient may choose another provider and the records will be forwarded after proper signature and notification.  Patient understands and agrees.  Call if any problem.  Precautions discussed.  Electronically Signed Lemond Stable, MD 9/10/202511:08 AM

## 2024-04-09 ENCOUNTER — Encounter: Payer: Self-pay | Admitting: Radiology
